# Patient Record
Sex: Female | Born: 1965 | Race: White | Hispanic: No | Smoking: Current some day smoker
Health system: Southern US, Community
[De-identification: ages and names within clinical notes are randomized; demographics above are authoritative.]

## PROBLEM LIST (undated history)

## (undated) DIAGNOSIS — E1140 Type 2 diabetes mellitus with diabetic neuropathy, unspecified: Secondary | ICD-10-CM

## (undated) DIAGNOSIS — I2510 Atherosclerotic heart disease of native coronary artery without angina pectoris: Secondary | ICD-10-CM

## (undated) DIAGNOSIS — F329 Major depressive disorder, single episode, unspecified: Secondary | ICD-10-CM

## (undated) DIAGNOSIS — I5032 Chronic diastolic (congestive) heart failure: Secondary | ICD-10-CM

## (undated) DIAGNOSIS — I10 Essential (primary) hypertension: Secondary | ICD-10-CM

## (undated) DIAGNOSIS — F32A Depression, unspecified: Secondary | ICD-10-CM

## (undated) DIAGNOSIS — I739 Peripheral vascular disease, unspecified: Secondary | ICD-10-CM

## (undated) DIAGNOSIS — E785 Hyperlipidemia, unspecified: Secondary | ICD-10-CM

## (undated) DIAGNOSIS — J45909 Unspecified asthma, uncomplicated: Secondary | ICD-10-CM

## (undated) DIAGNOSIS — E119 Type 2 diabetes mellitus without complications: Secondary | ICD-10-CM

## (undated) DIAGNOSIS — I219 Acute myocardial infarction, unspecified: Secondary | ICD-10-CM

## (undated) HISTORY — DX: Peripheral vascular disease, unspecified: I73.9

## (undated) HISTORY — DX: Depression, unspecified: F32.A

## (undated) HISTORY — PX: BELOW KNEE LEG AMPUTATION: SUR23

## (undated) HISTORY — DX: Type 2 diabetes mellitus with diabetic neuropathy, unspecified: E11.40

## (undated) HISTORY — PX: CORONARY ARTERY BYPASS GRAFT: SHX141

## (undated) HISTORY — DX: Atherosclerotic heart disease of native coronary artery without angina pectoris: I25.10

## (undated) HISTORY — DX: Type 2 diabetes mellitus without complications: E11.9

## (undated) HISTORY — DX: Acute myocardial infarction, unspecified: I21.9

## (undated) HISTORY — DX: Hyperlipidemia, unspecified: E78.5

## (undated) HISTORY — DX: Essential (primary) hypertension: I10

## (undated) HISTORY — DX: Chronic diastolic (congestive) heart failure: I50.32

## (undated) HISTORY — DX: Unspecified asthma, uncomplicated: J45.909

## (undated) HISTORY — DX: Major depressive disorder, single episode, unspecified: F32.9

---

## 2010-10-03 HISTORY — PX: CARDIAC CATHETERIZATION: SHX172

## 2013-01-16 ENCOUNTER — Inpatient Hospital Stay: Payer: Self-pay | Admitting: Internal Medicine

## 2013-01-16 LAB — COMPREHENSIVE METABOLIC PANEL
Albumin: 2.2 g/dL — ABNORMAL LOW (ref 3.4–5.0)
Alkaline Phosphatase: 185 U/L — ABNORMAL HIGH (ref 50–136)
Anion Gap: 8 (ref 7–16)
BUN: 34 mg/dL — ABNORMAL HIGH (ref 7–18)
Bilirubin,Total: 1.1 mg/dL — ABNORMAL HIGH (ref 0.2–1.0)
Calcium, Total: 8 mg/dL — ABNORMAL LOW (ref 8.5–10.1)
Chloride: 89 mmol/L — ABNORMAL LOW (ref 98–107)
Glucose: 398 mg/dL — ABNORMAL HIGH (ref 65–99)
Sodium: 123 mmol/L — ABNORMAL LOW (ref 136–145)

## 2013-01-16 LAB — CBC WITH DIFFERENTIAL/PLATELET
Lymphocyte %: 4.8 %
MCH: 30.1 pg (ref 26.0–34.0)
MCHC: 33.5 g/dL (ref 32.0–36.0)
MCV: 90 fL (ref 80–100)
Monocyte %: 3.5 %
Neutrophil #: 29.5 10*3/uL — ABNORMAL HIGH (ref 1.4–6.5)
Neutrophil %: 91.1 %
Platelet: 307 10*3/uL (ref 150–440)
RDW: 14.3 % (ref 11.5–14.5)
WBC: 32.4 10*3/uL — ABNORMAL HIGH (ref 3.6–11.0)

## 2013-01-16 LAB — URINALYSIS, COMPLETE
Bilirubin,UR: NEGATIVE
Blood: NEGATIVE
Glucose,UR: 150 mg/dL (ref 0–75)
Ketone: NEGATIVE
Nitrite: NEGATIVE
Ph: 5 (ref 4.5–8.0)
RBC,UR: 6 /HPF (ref 0–5)
Specific Gravity: 1.021 (ref 1.003–1.030)

## 2013-01-16 LAB — BASIC METABOLIC PANEL
BUN: 34 mg/dL — ABNORMAL HIGH (ref 7–18)
Calcium, Total: 7.2 mg/dL — ABNORMAL LOW (ref 8.5–10.1)
Chloride: 97 mmol/L — ABNORMAL LOW (ref 98–107)
Creatinine: 2.99 mg/dL — ABNORMAL HIGH (ref 0.60–1.30)
Glucose: 203 mg/dL — ABNORMAL HIGH (ref 65–99)
Potassium: 3.7 mmol/L (ref 3.5–5.1)
Sodium: 126 mmol/L — ABNORMAL LOW (ref 136–145)

## 2013-01-16 LAB — PROTEIN / CREATININE RATIO, URINE
Creatinine, Urine: 83.4 mg/dL (ref 30.0–125.0)
Protein/Creat. Ratio: 947 mg/gCREAT — ABNORMAL HIGH (ref 0–200)

## 2013-01-16 LAB — FERRITIN: Ferritin (ARMC): 133 ng/mL (ref 8–388)

## 2013-01-16 LAB — IRON AND TIBC: Iron: 14 ug/dL — ABNORMAL LOW (ref 50–170)

## 2013-01-16 LAB — HEMOGLOBIN A1C: Hemoglobin A1C: 9.3 % — ABNORMAL HIGH (ref 4.2–6.3)

## 2013-01-17 LAB — COMPREHENSIVE METABOLIC PANEL
Alkaline Phosphatase: 209 U/L — ABNORMAL HIGH (ref 50–136)
BUN: 35 mg/dL — ABNORMAL HIGH (ref 7–18)
Bilirubin,Total: 1 mg/dL (ref 0.2–1.0)
Calcium, Total: 7.6 mg/dL — ABNORMAL LOW (ref 8.5–10.1)
Co2: 24 mmol/L (ref 21–32)
EGFR (African American): 28 — ABNORMAL LOW
EGFR (Non-African Amer.): 25 — ABNORMAL LOW
SGPT (ALT): 83 U/L — ABNORMAL HIGH (ref 12–78)
Sodium: 129 mmol/L — ABNORMAL LOW (ref 136–145)

## 2013-01-17 LAB — CBC WITH DIFFERENTIAL/PLATELET
Basophil %: 0.1 %
Eosinophil #: 0.2 10*3/uL (ref 0.0–0.7)
Eosinophil %: 0.6 %
Lymphocyte #: 0.9 10*3/uL — ABNORMAL LOW (ref 1.0–3.6)
MCH: 30.2 pg (ref 26.0–34.0)
MCHC: 33.8 g/dL (ref 32.0–36.0)
Neutrophil %: 90.7 %
RBC: 3.38 10*6/uL — ABNORMAL LOW (ref 3.80–5.20)
RDW: 13.9 % (ref 11.5–14.5)
WBC: 27.4 10*3/uL — ABNORMAL HIGH (ref 3.6–11.0)

## 2013-01-17 LAB — PROTIME-INR
INR: 1.3
Prothrombin Time: 16.5 secs — ABNORMAL HIGH (ref 11.5–14.7)

## 2013-01-17 LAB — PROTEIN ELECTROPHORESIS(ARMC)

## 2013-01-18 LAB — BASIC METABOLIC PANEL
Calcium, Total: 8.2 mg/dL — ABNORMAL LOW (ref 8.5–10.1)
Chloride: 101 mmol/L (ref 98–107)
Creatinine: 1.73 mg/dL — ABNORMAL HIGH (ref 0.60–1.30)
EGFR (African American): 39 — ABNORMAL LOW
EGFR (Non-African Amer.): 34 — ABNORMAL LOW
Glucose: 219 mg/dL — ABNORMAL HIGH (ref 65–99)
Osmolality: 279 (ref 275–301)
Potassium: 4 mmol/L (ref 3.5–5.1)
Sodium: 133 mmol/L — ABNORMAL LOW (ref 136–145)

## 2013-01-18 LAB — CBC WITH DIFFERENTIAL/PLATELET
Basophil #: 0 10*3/uL (ref 0.0–0.1)
Basophil %: 0.2 %
Eosinophil #: 0.3 10*3/uL (ref 0.0–0.7)
HGB: 10 g/dL — ABNORMAL LOW (ref 12.0–16.0)
Lymphocyte #: 1.5 10*3/uL (ref 1.0–3.6)
Lymphocyte %: 5.9 %
MCH: 30.1 pg (ref 26.0–34.0)
MCV: 89 fL (ref 80–100)
Monocyte #: 1.2 x10 3/mm — ABNORMAL HIGH (ref 0.2–0.9)
Monocyte %: 5 %
Neutrophil #: 21.8 10*3/uL — ABNORMAL HIGH (ref 1.4–6.5)
Neutrophil %: 87.5 %
Platelet: 253 10*3/uL (ref 150–440)
RBC: 3.32 10*6/uL — ABNORMAL LOW (ref 3.80–5.20)
WBC: 25 10*3/uL — ABNORMAL HIGH (ref 3.6–11.0)

## 2013-01-19 LAB — BASIC METABOLIC PANEL
Anion Gap: 9 (ref 7–16)
BUN: 20 mg/dL — ABNORMAL HIGH (ref 7–18)
Calcium, Total: 7.8 mg/dL — ABNORMAL LOW (ref 8.5–10.1)
Co2: 23 mmol/L (ref 21–32)
Creatinine: 1.32 mg/dL — ABNORMAL HIGH (ref 0.60–1.30)
EGFR (African American): 55 — ABNORMAL LOW
EGFR (Non-African Amer.): 47 — ABNORMAL LOW
Glucose: 206 mg/dL — ABNORMAL HIGH (ref 65–99)
Osmolality: 279 (ref 275–301)
Sodium: 135 mmol/L — ABNORMAL LOW (ref 136–145)

## 2013-01-19 LAB — CBC WITH DIFFERENTIAL/PLATELET
Basophil #: 0.1 10*3/uL (ref 0.0–0.1)
Basophil %: 0.5 %
Eosinophil #: 0.2 10*3/uL (ref 0.0–0.7)
Eosinophil %: 1 %
HCT: 25.7 % — ABNORMAL LOW (ref 35.0–47.0)
Lymphocyte #: 1.4 10*3/uL (ref 1.0–3.6)
Lymphocyte %: 7.5 %
MCH: 30.2 pg (ref 26.0–34.0)
MCHC: 33.7 g/dL (ref 32.0–36.0)
Monocyte #: 1 x10 3/mm — ABNORMAL HIGH (ref 0.2–0.9)
Neutrophil #: 16.5 10*3/uL — ABNORMAL HIGH (ref 1.4–6.5)
Platelet: 262 10*3/uL (ref 150–440)
RBC: 2.88 10*6/uL — ABNORMAL LOW (ref 3.80–5.20)

## 2013-01-20 DIAGNOSIS — I200 Unstable angina: Secondary | ICD-10-CM

## 2013-01-20 LAB — CBC WITH DIFFERENTIAL/PLATELET
Basophil %: 0.5 %
Eosinophil #: 0.1 10*3/uL (ref 0.0–0.7)
HCT: 25.9 % — ABNORMAL LOW (ref 35.0–47.0)
HGB: 8.7 g/dL — ABNORMAL LOW (ref 12.0–16.0)
Lymphocyte #: 1.5 10*3/uL (ref 1.0–3.6)
Lymphocyte %: 8.3 %
MCH: 29.9 pg (ref 26.0–34.0)
MCV: 89 fL (ref 80–100)
Monocyte #: 1.2 x10 3/mm — ABNORMAL HIGH (ref 0.2–0.9)
Neutrophil #: 15.5 10*3/uL — ABNORMAL HIGH (ref 1.4–6.5)
WBC: 18.5 10*3/uL — ABNORMAL HIGH (ref 3.6–11.0)

## 2013-01-20 LAB — BASIC METABOLIC PANEL
Calcium, Total: 7.8 mg/dL — ABNORMAL LOW (ref 8.5–10.1)
Co2: 26 mmol/L (ref 21–32)
Creatinine: 1.09 mg/dL (ref 0.60–1.30)
EGFR (African American): >60
EGFR (Non-African Amer.): 60 — ABNORMAL LOW
Glucose: 88 mg/dL (ref 65–99)

## 2013-01-20 LAB — TROPONIN I: Troponin-I: 0.36 ng/mL — ABNORMAL HIGH

## 2013-01-21 DIAGNOSIS — R079 Chest pain, unspecified: Secondary | ICD-10-CM

## 2013-01-21 LAB — CBC WITH DIFFERENTIAL/PLATELET
Eosinophil #: 0.2 10*3/uL (ref 0.0–0.7)
HCT: 26.1 % — ABNORMAL LOW (ref 35.0–47.0)
HGB: 8.8 g/dL — ABNORMAL LOW (ref 12.0–16.0)
Lymphocyte #: 2.2 10*3/uL (ref 1.0–3.6)
Lymphocyte %: 11.1 %
MCH: 30.2 pg (ref 26.0–34.0)
MCHC: 33.6 g/dL (ref 32.0–36.0)
Monocyte #: 1.2 x10 3/mm — ABNORMAL HIGH (ref 0.2–0.9)
Neutrophil #: 16.1 10*3/uL — ABNORMAL HIGH (ref 1.4–6.5)
RBC: 2.9 10*6/uL — ABNORMAL LOW (ref 3.80–5.20)

## 2013-01-21 LAB — BASIC METABOLIC PANEL
Chloride: 104 mmol/L (ref 98–107)
Co2: 27 mmol/L (ref 21–32)
EGFR (African American): >60
EGFR (Non-African Amer.): >60
Osmolality: 271 (ref 275–301)
Sodium: 137 mmol/L (ref 136–145)

## 2013-01-21 LAB — TROPONIN I: Troponin-I: 0.44 ng/mL — ABNORMAL HIGH

## 2013-01-21 LAB — CK-MB: CK-MB: 1.4 ng/mL (ref 0.5–3.6)

## 2013-01-21 LAB — CULTURE, BLOOD (SINGLE)

## 2013-01-22 LAB — CBC WITH DIFFERENTIAL/PLATELET
Basophil %: 0.2 %
Eosinophil #: 0.2 10*3/uL (ref 0.0–0.7)
Eosinophil %: 0.9 %
HGB: 8.6 g/dL — ABNORMAL LOW (ref 12.0–16.0)
Lymphocyte %: 11.5 %
MCV: 90 fL (ref 80–100)
Monocyte #: 0.9 x10 3/mm (ref 0.2–0.9)
Neutrophil #: 14.2 10*3/uL — ABNORMAL HIGH (ref 1.4–6.5)
RDW: 14.1 % (ref 11.5–14.5)
WBC: 17.3 10*3/uL — ABNORMAL HIGH (ref 3.6–11.0)

## 2013-01-22 LAB — DRUG SCREEN, URINE
Barbiturates, Ur Screen: NEGATIVE (ref ?–200)
Benzodiazepine, Ur Scrn: NEGATIVE (ref ?–200)
Cannabinoid 50 Ng, Ur ~~LOC~~: NEGATIVE (ref ?–50)
MDMA (Ecstasy)Ur Screen: NEGATIVE (ref ?–500)
Methadone, Ur Screen: NEGATIVE (ref ?–300)
Phencyclidine (PCP) Ur S: NEGATIVE (ref ?–25)
Tricyclic, Ur Screen: NEGATIVE (ref ?–1000)

## 2013-01-22 LAB — BASIC METABOLIC PANEL
BUN: 8 mg/dL (ref 7–18)
Chloride: 103 mmol/L (ref 98–107)
Glucose: 183 mg/dL — ABNORMAL HIGH (ref 65–99)
Potassium: 4 mmol/L (ref 3.5–5.1)

## 2013-01-22 LAB — PATHOLOGY REPORT

## 2013-01-23 LAB — BASIC METABOLIC PANEL
Anion Gap: 10 (ref 7–16)
Calcium, Total: 7.1 mg/dL — ABNORMAL LOW (ref 8.5–10.1)
Co2: 27 mmol/L (ref 21–32)
EGFR (African American): >60
EGFR (Non-African Amer.): 59 — ABNORMAL LOW
Glucose: 242 mg/dL — ABNORMAL HIGH (ref 65–99)
Sodium: 133 mmol/L — ABNORMAL LOW (ref 136–145)

## 2013-01-23 LAB — CBC WITH DIFFERENTIAL/PLATELET
Basophil #: 0 10*3/uL (ref 0.0–0.1)
Basophil %: 0.1 %
Eosinophil %: 1.5 %
HCT: 23.2 % — ABNORMAL LOW (ref 35.0–47.0)
Lymphocyte %: 14.1 %
MCHC: 33.3 g/dL (ref 32.0–36.0)
MCV: 90 fL (ref 80–100)
Monocyte #: 0.8 x10 3/mm (ref 0.2–0.9)
Monocyte %: 6 %
Neutrophil #: 10.9 10*3/uL — ABNORMAL HIGH (ref 1.4–6.5)
Neutrophil %: 78.3 %
Platelet: 415 10*3/uL (ref 150–440)
RDW: 14.3 % (ref 11.5–14.5)

## 2013-01-24 LAB — CBC WITH DIFFERENTIAL/PLATELET
Basophil #: 0 10*3/uL (ref 0.0–0.1)
Basophil %: 0.3 %
Eosinophil #: 0.2 10*3/uL (ref 0.0–0.7)
Eosinophil %: 1.4 %
HCT: 23.2 % — ABNORMAL LOW (ref 35.0–47.0)
MCV: 90 fL (ref 80–100)
Monocyte #: 0.8 x10 3/mm (ref 0.2–0.9)
Monocyte %: 5 %
Neutrophil %: 80.7 %
Platelet: 553 10*3/uL — ABNORMAL HIGH (ref 150–440)
WBC: 15.5 10*3/uL — ABNORMAL HIGH (ref 3.6–11.0)

## 2013-01-24 LAB — BASIC METABOLIC PANEL
BUN: 8 mg/dL (ref 7–18)
Calcium, Total: 7.3 mg/dL — ABNORMAL LOW (ref 8.5–10.1)
Co2: 29 mmol/L (ref 21–32)
Creatinine: 1.09 mg/dL (ref 0.60–1.30)
EGFR (African American): >60
Glucose: 310 mg/dL — ABNORMAL HIGH (ref 65–99)
Osmolality: 275 (ref 275–301)
Potassium: 3.7 mmol/L (ref 3.5–5.1)
Sodium: 132 mmol/L — ABNORMAL LOW (ref 136–145)

## 2013-01-24 LAB — VANCOMYCIN, TROUGH: Vancomycin, Trough: 12 ug/mL (ref 10–20)

## 2013-01-25 LAB — CBC WITH DIFFERENTIAL/PLATELET
Basophil %: 0.4 %
Eosinophil #: 0.2 10*3/uL (ref 0.0–0.7)
Eosinophil %: 1.6 %
Lymphocyte #: 1.9 10*3/uL (ref 1.0–3.6)
MCV: 90 fL (ref 80–100)
Monocyte #: 0.5 x10 3/mm (ref 0.2–0.9)
Monocyte %: 4.9 %
Neutrophil #: 8.3 10*3/uL — ABNORMAL HIGH (ref 1.4–6.5)
Neutrophil %: 75.8 %
Platelet: 513 10*3/uL — ABNORMAL HIGH (ref 150–440)
RBC: 2.57 10*6/uL — ABNORMAL LOW (ref 3.80–5.20)
RDW: 14.1 % (ref 11.5–14.5)

## 2013-01-26 ENCOUNTER — Inpatient Hospital Stay: Payer: Self-pay | Admitting: Internal Medicine

## 2013-01-26 DIAGNOSIS — R079 Chest pain, unspecified: Secondary | ICD-10-CM

## 2013-01-26 LAB — URINALYSIS, COMPLETE
Bacteria: NONE SEEN
Bilirubin,UR: NEGATIVE
Glucose,UR: 50 mg/dL (ref 0–75)
Ketone: NEGATIVE
Leukocyte Esterase: NEGATIVE
Nitrite: NEGATIVE
Ph: 6 (ref 4.5–8.0)
Protein: >=500
RBC,UR: 169 /HPF (ref 0–5)
Specific Gravity: 1.03 (ref 1.003–1.030)
WBC UR: 14 /HPF (ref 0–5)

## 2013-01-26 LAB — DRUG SCREEN, URINE
Amphetamines, Ur Screen: NEGATIVE (ref ?–1000)
Barbiturates, Ur Screen: NEGATIVE (ref ?–200)
Benzodiazepine, Ur Scrn: POSITIVE (ref ?–200)
Cannabinoid 50 Ng, Ur ~~LOC~~: NEGATIVE (ref ?–50)
Cocaine Metabolite,Ur ~~LOC~~: NEGATIVE (ref ?–300)
MDMA (Ecstasy)Ur Screen: NEGATIVE (ref ?–500)
Methadone, Ur Screen: NEGATIVE (ref ?–300)
Tricyclic, Ur Screen: NEGATIVE (ref ?–1000)

## 2013-01-26 LAB — ETHANOL
Ethanol %: <0.003 % (ref 0.000–0.080)
Ethanol: <3 mg/dL

## 2013-01-26 LAB — BASIC METABOLIC PANEL
Anion Gap: 8 (ref 7–16)
BUN: 11 mg/dL (ref 7–18)
Co2: 27 mmol/L (ref 21–32)
EGFR (African American): >60
Glucose: 243 mg/dL — ABNORMAL HIGH (ref 65–99)
Osmolality: 283 (ref 275–301)
Potassium: 4 mmol/L (ref 3.5–5.1)
Sodium: 138 mmol/L (ref 136–145)

## 2013-01-26 LAB — CBC
HGB: 8.9 g/dL — ABNORMAL LOW (ref 12.0–16.0)
MCH: 28.9 pg (ref 26.0–34.0)
MCV: 91 fL (ref 80–100)
Platelet: 616 10*3/uL — ABNORMAL HIGH (ref 150–440)
RDW: 14.4 % (ref 11.5–14.5)
WBC: 13.7 10*3/uL — ABNORMAL HIGH (ref 3.6–11.0)

## 2013-01-26 LAB — CK TOTAL AND CKMB (NOT AT ARMC)
CK, Total: 87 U/L (ref 21–215)
CK-MB: 1.3 ng/mL (ref 0.5–3.6)

## 2013-01-26 LAB — TROPONIN I: Troponin-I: 0.17 ng/mL — ABNORMAL HIGH

## 2013-01-26 LAB — PRO B NATRIURETIC PEPTIDE: B-Type Natriuretic Peptide: 13557 pg/mL — ABNORMAL HIGH (ref 0–125)

## 2013-01-27 LAB — CBC WITH DIFFERENTIAL/PLATELET
Basophil #: 0.1 10*3/uL (ref 0.0–0.1)
Basophil %: 0.5 %
Eosinophil #: 0.2 10*3/uL (ref 0.0–0.7)
Eosinophil %: 1.2 %
HGB: 8.7 g/dL — ABNORMAL LOW (ref 12.0–16.0)
Lymphocyte %: 16.6 %
MCH: 29.3 pg (ref 26.0–34.0)
MCHC: 32.3 g/dL (ref 32.0–36.0)
MCV: 91 fL (ref 80–100)
Monocyte %: 4.5 %
Neutrophil #: 10 10*3/uL — ABNORMAL HIGH (ref 1.4–6.5)
Neutrophil %: 77.2 %
Platelet: 652 10*3/uL — ABNORMAL HIGH (ref 150–440)
RDW: 14.4 % (ref 11.5–14.5)
WBC: 13 10*3/uL — ABNORMAL HIGH (ref 3.6–11.0)

## 2013-01-27 LAB — APTT: Activated PTT: 70.4 secs — ABNORMAL HIGH (ref 23.6–35.9)

## 2013-01-27 LAB — TROPONIN I: Troponin-I: 0.16 ng/mL — ABNORMAL HIGH

## 2013-01-27 LAB — BASIC METABOLIC PANEL
Anion Gap: 6 — ABNORMAL LOW (ref 7–16)
Calcium, Total: 8.1 mg/dL — ABNORMAL LOW (ref 8.5–10.1)
Co2: 30 mmol/L (ref 21–32)
Creatinine: 1.1 mg/dL (ref 0.60–1.30)
EGFR (African American): >60
EGFR (Non-African Amer.): 59 — ABNORMAL LOW
Potassium: 4 mmol/L (ref 3.5–5.1)
Sodium: 139 mmol/L (ref 136–145)

## 2013-01-27 LAB — CK TOTAL AND CKMB (NOT AT ARMC): CK, Total: 37 U/L (ref 21–215)

## 2013-01-28 DIAGNOSIS — R079 Chest pain, unspecified: Secondary | ICD-10-CM

## 2013-01-28 DIAGNOSIS — I214 Non-ST elevation (NSTEMI) myocardial infarction: Secondary | ICD-10-CM

## 2013-01-28 LAB — BASIC METABOLIC PANEL
BUN: 16 mg/dL (ref 7–18)
Calcium, Total: 8.2 mg/dL — ABNORMAL LOW (ref 8.5–10.1)
Chloride: 99 mmol/L (ref 98–107)
Creatinine: 1.32 mg/dL — ABNORMAL HIGH (ref 0.60–1.30)
EGFR (African American): 55 — ABNORMAL LOW
Osmolality: 276 (ref 275–301)
Potassium: 4 mmol/L (ref 3.5–5.1)

## 2013-01-28 LAB — CBC WITH DIFFERENTIAL/PLATELET
Basophil %: 0.5 %
Eosinophil #: 0.3 10*3/uL (ref 0.0–0.7)
Eosinophil %: 2.1 %
HCT: 23.7 % — ABNORMAL LOW (ref 35.0–47.0)
Lymphocyte #: 2.4 10*3/uL (ref 1.0–3.6)
Lymphocyte %: 18.6 %
MCH: 30.3 pg (ref 26.0–34.0)
MCV: 89 fL (ref 80–100)
Monocyte %: 7 %
Neutrophil %: 71.8 %
RBC: 2.67 10*6/uL — ABNORMAL LOW (ref 3.80–5.20)
RDW: 14.4 % (ref 11.5–14.5)

## 2013-01-28 LAB — HEMOGLOBIN: HGB: 8.1 g/dL — ABNORMAL LOW (ref 12.0–16.0)

## 2013-01-28 LAB — APTT: Activated PTT: 55.3 secs — ABNORMAL HIGH (ref 23.6–35.9)

## 2013-01-29 ENCOUNTER — Encounter: Payer: Self-pay | Admitting: *Deleted

## 2013-01-30 ENCOUNTER — Encounter: Payer: Self-pay | Admitting: Physician Assistant

## 2013-01-30 ENCOUNTER — Ambulatory Visit (INDEPENDENT_AMBULATORY_CARE_PROVIDER_SITE_OTHER): Payer: Medicaid Other | Admitting: Physician Assistant

## 2013-01-30 DIAGNOSIS — I25810 Atherosclerosis of coronary artery bypass graft(s) without angina pectoris: Secondary | ICD-10-CM

## 2013-01-30 NOTE — Progress Notes (Signed)
Error. No show.

## 2013-01-30 NOTE — Progress Notes (Deleted)
Date:  01/30/2013   ID:  [name], DOB [birth year], MRN [id number]  PCP:  No primary provider on file.  Primary Cardiologist:  M. Kirke Corin, MD  History of Present Illness: [name] is a 47 y.o. female with PMHx s/f CAD s/p CABG at [facility] in 11/2011, chronic diastolic CHF (EF 69%), PVD s/p LLE amputation 2013, segmental RLE angioplasties 01/23/13, type 2 DM with diabetic neuropathy, HTN, HLD, asthma and depression who presents to the office today for post-hospital follow-up.  She was seen twice in consultation last month at [facility]. Her initial admission was in the setting of severe RLE PAD with resultant nonhealing ulcers, cellulitis and sepsis. Cardiology was consulted for pre-op eval prior to revascularization. She was noted to have chronic CAD with stable angina, was optimized on cardiac medications, and underwent multiple segmental angioplasties to her RLE.   After discharge on 01/26/15, she re-presented the following day to [facility] ED c/o chest discomfort. Troponin did return mildly elevated ruling her in for NSTEMI. CT-A chest revealed no PE, mild interstitial edema and reactive lymphadenopathy. She was evaluated by Dr. Jens Som, and the plan was made to proceed with noninvasive ischemic testing due to her underlying comorbidities, ongoing recovery from recent sepsis/cellulitisl/RLE revascularization and history of contrast-induced nephropathy. She was continued on all cardiac meds including Lasix.   Lexiscan Myoview 01/28/13 revealed significant GI uptake artifact, likely moderate mid-distal anterior and anterolateral wall reversible ischemia and EF 55%.  She was seen by Dr. Kirke Corin the following day, and given the aforementioned factors increasing the risk of cardiac cath, the decision was made to treat medically. BB was up-titrated. She presents today for post-hospital follow-up.    Wt Readings from Last 3 Encounters:  No data found for Wt     Past Medical History  Diagnosis Date   . Coronary artery disease   . Diabetes mellitus without complication   . Diabetic neuropathy   . Depression   . Hyperlipidemia   . Asthma   . Peripheral artery disease     s/p LLE amputation 2013; segmental angioplasties to RLE 01/23/13  . Chronic diastolic CHF (congestive heart failure)     Current Outpatient Prescriptions  Medication Sig Dispense Refill  . acetaminophen (MAPAP) 325 MG tablet Take 650 mg by mouth 4 (four) times daily.      Marland Kitchen aspirin 81 MG tablet Take 81 mg by mouth daily.      . bisacodyl (DULCOLAX) 5 MG EC tablet Take 10 mg by mouth daily.      Marland Kitchen docusate sodium (COLACE) 100 MG capsule Take 100 mg by mouth 3 (three) times daily.      . ferrous sulfate 325 (65 FE) MG tablet Take 325 mg by mouth daily with breakfast.      . guaifenesin (ROBITUSSIN) 100 MG/5ML syrup Take 10 mLs by mouth every 6 (six) hours as needed for cough.      . insulin regular (NOVOLIN R,HUMULIN R) 100 units/mL injection Inject 17 Units into the skin 2 (two) times daily before a meal.      . isosorbide mononitrate (IMDUR) 60 MG 24 hr tablet Take 60 mg by mouth daily.      . nitroGLYCERIN (NITROSTAT) 0.4 MG SL tablet Place 0.4 mg under the tongue every 5 (five) minutes as needed for chest pain.      Marland Kitchen oxyCODONE (OXY IR/ROXICODONE) 5 MG immediate release tablet Take 5 mg by mouth as needed for pain.      . promethazine (  PHENERGAN) 25 MG tablet Take 25 mg by mouth every 6 (six) hours as needed for nausea.      . silver sulfADIAZINE (SILVADENE) 1 % cream Apply topically daily.      . traZODone (DESYREL) 50 MG tablet Take 50 mg by mouth at bedtime.       No current facility-administered medications for this visit.    Allergies:   Allergies no known allergies  Social History:  The patient  reports that she has quit smoking. She does not have any smokeless tobacco history on file. She reports that she does not drink alcohol or use illicit drugs.   ROS:  Please see the history of present illness.    ***   All other systems reviewed and negative.   PHYSICAL EXAM: VS:  There were no vitals taken for this visit. Well nourished, well developed, in no acute distress HEENT: normal Neck: no JVD Cardiac:  normal S1, S2; RRR; no murmur Lungs:  clear to auscultation bilaterally, no wheezing, rhonchi or rales Abd: soft, nontender, no hepatomegaly Ext: no edema Skin: warm and dry Neuro:  CNs 2-12 intact, no focal abnormalities noted  EKG:  ***     ASSESSMENT AND PLAN:  1. ***   Signed,   R. Hurman Horn, PA-C 01/30/2013, 11:39 AM

## 2013-02-01 ENCOUNTER — Telehealth: Payer: Self-pay

## 2013-02-01 ENCOUNTER — Encounter: Payer: Self-pay | Admitting: Physician Assistant

## 2013-02-01 ENCOUNTER — Other Ambulatory Visit: Payer: Self-pay

## 2013-02-01 ENCOUNTER — Ambulatory Visit (INDEPENDENT_AMBULATORY_CARE_PROVIDER_SITE_OTHER): Payer: Medicaid Other | Admitting: Physician Assistant

## 2013-02-01 VITALS — BP 156/90 | HR 106 | Ht 64.5 in | Wt 205.0 lb

## 2013-02-01 DIAGNOSIS — I2510 Atherosclerotic heart disease of native coronary artery without angina pectoris: Secondary | ICD-10-CM

## 2013-02-01 DIAGNOSIS — I5032 Chronic diastolic (congestive) heart failure: Secondary | ICD-10-CM

## 2013-02-01 DIAGNOSIS — I739 Peripheral vascular disease, unspecified: Secondary | ICD-10-CM

## 2013-02-01 DIAGNOSIS — I509 Heart failure, unspecified: Secondary | ICD-10-CM

## 2013-02-01 DIAGNOSIS — R0902 Hypoxemia: Secondary | ICD-10-CM

## 2013-02-01 DIAGNOSIS — R0609 Other forms of dyspnea: Secondary | ICD-10-CM

## 2013-02-01 DIAGNOSIS — R0600 Dyspnea, unspecified: Secondary | ICD-10-CM

## 2013-02-01 LAB — CBC WITH DIFFERENTIAL/PLATELET
Basophil %: 0.3 %
Eosinophil #: 0.1 10*3/uL (ref 0.0–0.7)
Eosinophil %: 0.8 %
HCT: 29.8 % — ABNORMAL LOW (ref 35.0–47.0)
Lymphocyte #: 1.8 10*3/uL (ref 1.0–3.6)
MCH: 30.1 pg (ref 26.0–34.0)
MCV: 90 fL (ref 80–100)
Monocyte #: 1.1 x10 3/mm — ABNORMAL HIGH (ref 0.2–0.9)
Monocyte %: 7.1 %
Neutrophil #: 12.3 10*3/uL — ABNORMAL HIGH (ref 1.4–6.5)
Platelet: 653 10*3/uL — ABNORMAL HIGH (ref 150–440)
RDW: 15.3 % — ABNORMAL HIGH (ref 11.5–14.5)

## 2013-02-01 LAB — COMPREHENSIVE METABOLIC PANEL
Albumin: 1.8 g/dL — ABNORMAL LOW (ref 3.4–5.0)
Alkaline Phosphatase: 178 U/L — ABNORMAL HIGH (ref 50–136)
Anion Gap: 5 — ABNORMAL LOW (ref 7–16)
BUN: 25 mg/dL — ABNORMAL HIGH (ref 7–18)
Calcium, Total: 8.8 mg/dL (ref 8.5–10.1)
Creatinine: 1.33 mg/dL — ABNORMAL HIGH (ref 0.60–1.30)
EGFR (African American): 54 — ABNORMAL LOW
Osmolality: 286 (ref 275–301)
Potassium: 4.4 mmol/L (ref 3.5–5.1)

## 2013-02-01 MED ORDER — NITROGLYCERIN 0.4 MG SL SUBL: 0.4000 mg | SUBLINGUAL_TABLET | SUBLINGUAL | Status: AC | PRN

## 2013-02-01 MED ORDER — METOPROLOL TARTRATE 100 MG PO TABS: 100.0000 mg | ORAL_TABLET | Freq: Two times a day (BID) | ORAL | Status: DC

## 2013-02-01 NOTE — Assessment & Plan Note (Signed)
Suspect shortness of breath may be attributed to developed acute on chronic diastolic CHF and/or as part of a constellation of unstable cardiac ischemic symptoms. She is hypoxic in the office today requiring supplemental oxygen. Fairly euvolemic on exam with clear lungs. Have advised to continue Lasix use, limit salt/fluid intake, monitor daily weights and symptoms. She will call with signs/symptoms concerning for worsening fluid overload.

## 2013-02-01 NOTE — Telephone Encounter (Signed)
Verbal instructions given to pt re: cath and I also informed her [facility] may be bringing O2 today, per Henderson Newcomer, RN with [facility]

## 2013-02-01 NOTE — Telephone Encounter (Signed)
error 

## 2013-02-01 NOTE — Progress Notes (Signed)
Date:  02/01/2013   ID:  [name], DOB [birth year], MRN [id number]  PCP:  Pcp Not In System  Primary Cardiologist:  Seen by M. Kirke Corin, MD in consultation at [facility]  History of Present Illness: [name] is a 47 y.o. female with PMHx s/f CAD s/p CABG (11/2011 at [facility]), PVD (s/p L BKA in 09/2012 at [facility]), chronic diastolic CHF, DM2 with diabetic neuropathy/vasculopathy, HLD, asthma and depression.   She was seen in consultation by myself and Dr. Kirke Corin on 01/21/13. She was admitted to [facility] on 01/16/13 for sepsis secondary to RLE cellulitis with progressive necrosis. She presented severe RLE pain, redness and swelling. On evaluation, she was found to be septic, which was managed and stabilized by the medicine team. There was no evidence of osteomyelitis. She was started on broad-spectrum antibiotics. Cardiology was consulted for a pre-op evaluation prior to undergoing any revascularization procedure.   On review, her troponin did elevate mildly. The significance of this was unclear given co-occuring sepsis, acute renal failure, lack of chest pain on admission and no EKG changes, but was felt to represent demand ischemia from her active infection. She did have an episode of chest discomfort several days into the admission relieved with NTG. EKG again remained normal. There were no further episodes.  She underwent a Lexiscan Myoview which revealed LVEF 55%, moderate size reversible defect in the mid-distal anterior and anterolateral wall likely felt to represent anterior/anterolateral ischemia despite poor quality study and intense GI uptake. Options including cardiac catheterization were discussed with the patient who elected to defer. Medical management was pursued.   She underwent segmental balloon angioplasties to the RLE peripheral vascular system. She was discharged on 01/25/13 on the medications listed below.   She presents today for post-hospital follow-up. Her leg has felt well.  Swelling and pain improving. She has home health care, active wound care and vascular appointments lined up.   She reports experiencing intermittent substernal chest pressure lasting 5-6 minutes without radiation, relieved with NTG SL x 1, similar in quality, but less in severity to her prior MI for the past 1-2 days. She notes associated shortness of breath with this. She has been short of breath all day today. She is on supplementation provided in the office today, and her breathing has improved. She has continued to take all cardiac medications as prescribed. She only recently filled her Lasix prescription, and took her first dose yesterday.   EKG reveals sinus tachycardia 106 bpm, <1 mm ST depressions V2-V6, TWIs I, aVL (unchanged from prior tracings)  BP 156/90.  Wt Readings from Last 3 Encounters:  02/01/13 205 lb (92.987 kg)     Past Medical History  Diagnosis Date  . Diabetes mellitus   . Diabetic neuropathy   . Depression   . Hyperlipidemia   . Asthma   . Peripheral artery disease     s/p LLE amputation 2013; segmental angioplasties to RLE 01/23/13  . Chronic diastolic CHF (congestive heart failure)   . Coronary artery disease     a. CABG 11/2011 at [facility] b. NSTEMI 01/2013- nuc stress test w/ moderate anterior reversible ischemia, EF 55%, medically managed  . MI (myocardial infarction)   . Hypertension     Current Outpatient Prescriptions  Medication Sig Dispense Refill  . acetaminophen (MAPAP) 325 MG tablet Take 650 mg by mouth as needed.       Marland Kitchen aspirin 81 MG tablet Take 81 mg by mouth daily.      Marland Kitchen  atorvastatin (LIPITOR) 40 MG tablet Take 40 mg by mouth daily.      . clopidogrel (PLAVIX) 75 MG tablet Take 75 mg by mouth daily.      Marland Kitchen docusate sodium (COLACE) 100 MG capsule Take 100 mg by mouth 3 (three) times daily.      . DULoxetine (CYMBALTA) 60 MG capsule Take 60 mg by mouth daily.      . ferrous sulfate 325 (65 FE) MG tablet Take 325 mg by mouth daily with breakfast.       . gabapentin (NEURONTIN) 300 MG capsule Take 300 mg by mouth 3 (three) times daily.      Marland Kitchen guaifenesin (ROBITUSSIN) 100 MG/5ML syrup Take 10 mLs by mouth every 6 (six) hours as needed for cough.      . Ipratropium-Albuterol (COMBIVENT RESPIMAT) 20-100 MCG/ACT AERS respimat Inhale 2 puffs into the lungs as needed for wheezing.      . isosorbide mononitrate (IMDUR) 60 MG 24 hr tablet Take 60 mg by mouth daily.      . metoprolol (LOPRESSOR) 50 MG tablet Take 50 mg by mouth 2 (two) times daily.      . nitroGLYCERIN (NITROSTAT) 0.4 MG SL tablet Place 0.4 mg under the tongue every 5 (five) minutes as needed for chest pain.      Marland Kitchen oxyCODONE (OXY IR/ROXICODONE) 5 MG immediate release tablet Take 5 mg by mouth as needed for pain.      . promethazine (PHENERGAN) 25 MG tablet Take 25 mg by mouth daily.       . silver sulfADIAZINE (SILVADENE) 1 % cream Apply topically daily.      . traZODone (DESYREL) 50 MG tablet Take 50 mg by mouth at bedtime.      Marland Kitchen amoxicillin-clavulanate (AUGMENTIN) 875-125 MG per tablet Take 1 tablet by mouth 2 (two) times daily.      . insulin regular (NOVOLIN R,HUMULIN R) 100 units/mL injection Inject 20 Units into the skin 2 (two) times daily before a meal.        No current facility-administered medications for this visit.    Allergies:    Allergies  Allergen Reactions  . Accupril (Quinapril Hcl)     Social History:  The patient  reports that she has quit smoking. Her smoking use included Cigarettes. She has a 5 pack-year smoking history. She does not have any smokeless tobacco history on file. She reports that she does not drink alcohol or use illicit drugs.   ROS:  Please see the history of present illness. No fevers or chills. No active bleeding.  All other systems reviewed and negative.   PHYSICAL EXAM: VS:  BP 156/90  Pulse 106  Ht 5' 4.5" (1.638 m)  Wt 205 lb (92.987 kg)  BMI 34.66 kg/m2 Well nourished, well developed, in no acute distress HEENT:  normal Neck: no JVD Cardiac:  normal S1, S2; RRR; no murmur Lungs:  clear to auscultation bilaterally, no wheezing, rhonchi or rales Abd: soft, nontender, no hepatomegaly Ext: LLE amputation appreciated. RLE erythematous with diffuse scaling patches. Wound wraps. Palpation limited due to pain. Minimal appreciable nonpitting edema. Skin: warm and dry Neuro:  CNs 2-12 intact, no focal abnormalities noted

## 2013-02-01 NOTE — Telephone Encounter (Signed)
LMTCB WU:[id number] instructions and O2 delivery

## 2013-02-01 NOTE — Assessment & Plan Note (Addendum)
Recovering s/p RLE segmental angioplasties. Continued on ASA/Plavix, statin. Praised on sustained tobacco cessation. Controlling diabetes will be essential. Advised to continue previously scheduled vascular and PCP follow-up, and to continue wound management.

## 2013-02-01 NOTE — Patient Instructions (Addendum)
We will plan for a cardiac catheterization the week of May 12-16, 2014. You will be called when this has been scheduled.   Please increace metoprolol tartrate (Lopressor) to 100 mg twice a day.   We will refill this and your nitroglycerin. Please take as prescribed.   Please continue with your previously scheduled follow-up appointments as planned.   Please continue to take Lasix as prescribed. Monitor your weights daily, reduce salt and fluid intake. Please call the office for worsening shortness of breath, chest pain or swelling.

## 2013-02-01 NOTE — Assessment & Plan Note (Signed)
She complains of increased episodes of chest discomfort similar to her prior MI, relieved with NTG and associated with shortness of breath. She is vasculopathic with a history of CAD s/p recent CABG. She underwent a Lexiscan Myoview which was abnormal suggesting single-vessel disease. She declined cath at that time, but is now interested in the procedure given her ongoing discomfort. EKG without significant interval change. After conferring with Dr. Kirke Corin, the risks, benefits and details of the procedure were described in detail and offered to the patient who agreed to proceed. Will increase metoprolol to 100mg  BID dosing given tachycardia. Will refill NTG SL PRN.

## 2013-02-01 NOTE — Assessment & Plan Note (Signed)
Patient short of breath on initial arrival requiring supplemental oxygen in the office. O2 sat 86-87% on RA, 95-97% on 2L oxygen via nasal cannula. Unable to ambulate due to RLE amputation and wheel chair bound status after recent RLE angioplasty/cellulitis. Will provide with home oxygen.

## 2013-02-02 ENCOUNTER — Inpatient Hospital Stay: Payer: Self-pay | Admitting: Internal Medicine

## 2013-02-02 DIAGNOSIS — I5033 Acute on chronic diastolic (congestive) heart failure: Secondary | ICD-10-CM

## 2013-02-02 LAB — APTT
Activated PTT: 35 secs (ref 23.6–35.9)
Activated PTT: 51.7 secs — ABNORMAL HIGH (ref 23.6–35.9)
Activated PTT: 55 secs — ABNORMAL HIGH (ref 23.6–35.9)

## 2013-02-02 LAB — TROPONIN I: Troponin-I: 0.35 ng/mL — ABNORMAL HIGH

## 2013-02-02 LAB — CK TOTAL AND CKMB (NOT AT ARMC)
CK, Total: 76 U/L (ref 21–215)
CK, Total: 55 U/L (ref 21–215)
CK-MB: 3.1 ng/mL (ref 0.5–3.6)

## 2013-02-03 LAB — BASIC METABOLIC PANEL
Anion Gap: 4 — ABNORMAL LOW (ref 7–16)
BUN: 25 mg/dL — ABNORMAL HIGH (ref 7–18)
EGFR (Non-African Amer.): 45 — ABNORMAL LOW
Glucose: 114 mg/dL — ABNORMAL HIGH (ref 65–99)
Osmolality: 279 (ref 275–301)
Sodium: 137 mmol/L (ref 136–145)

## 2013-02-03 LAB — CBC WITH DIFFERENTIAL/PLATELET
Basophil #: 0.2 10*3/uL — ABNORMAL HIGH (ref 0.0–0.1)
Basophil %: 1.4 %
Eosinophil %: 1.9 %
HGB: 8.3 g/dL — ABNORMAL LOW (ref 12.0–16.0)
Lymphocyte %: 24.5 %
MCH: 29.8 pg (ref 26.0–34.0)
MCHC: 33.7 g/dL (ref 32.0–36.0)
MCV: 88 fL (ref 80–100)
Monocyte #: 0.9 x10 3/mm (ref 0.2–0.9)
Neutrophil #: 7.4 10*3/uL — ABNORMAL HIGH (ref 1.4–6.5)
Neutrophil %: 64.6 %
RBC: 2.79 10*6/uL — ABNORMAL LOW (ref 3.80–5.20)
WBC: 11.5 10*3/uL — ABNORMAL HIGH (ref 3.6–11.0)

## 2013-02-03 LAB — LIPID PANEL
Cholesterol: 127 mg/dL (ref 0–200)
HDL Cholesterol: 40 mg/dL (ref 40–60)
Ldl Cholesterol, Calc: 63 mg/dL (ref 0–100)
Triglycerides: 119 mg/dL (ref 0–200)
VLDL Cholesterol, Calc: 24 mg/dL (ref 5–40)

## 2013-02-03 LAB — TROPONIN I: Troponin-I: 0.41 ng/mL — ABNORMAL HIGH

## 2013-02-03 LAB — APTT
Activated PTT: 47.3 secs — ABNORMAL HIGH (ref 23.6–35.9)
Activated PTT: 71.1 secs — ABNORMAL HIGH (ref 23.6–35.9)

## 2013-02-04 ENCOUNTER — Encounter: Payer: Self-pay | Admitting: Cardiovascular Disease

## 2013-02-04 DIAGNOSIS — I214 Non-ST elevation (NSTEMI) myocardial infarction: Secondary | ICD-10-CM

## 2013-02-04 LAB — CBC WITH DIFFERENTIAL/PLATELET
Basophil #: 0.1 10*3/uL (ref 0.0–0.1)
Basophil %: 0.8 %
Eosinophil #: 0.2 10*3/uL (ref 0.0–0.7)
Eosinophil %: 1.5 %
HCT: 25.1 % — ABNORMAL LOW (ref 35.0–47.0)
HGB: 8.7 g/dL — ABNORMAL LOW (ref 12.0–16.0)
Lymphocyte #: 2.1 10*3/uL (ref 1.0–3.6)
Lymphocyte %: 19.1 %
MCH: 30.7 pg (ref 26.0–34.0)
MCHC: 34.6 g/dL (ref 32.0–36.0)
MCV: 89 fL (ref 80–100)
Monocyte #: 1.1 x10 3/mm — ABNORMAL HIGH (ref 0.2–0.9)
Monocyte %: 9.6 %
Neutrophil #: 7.7 10*3/uL — ABNORMAL HIGH (ref 1.4–6.5)
Neutrophil %: 69 %
Platelet: 466 10*3/uL — ABNORMAL HIGH (ref 150–440)
RBC: 2.84 10*6/uL — ABNORMAL LOW (ref 3.80–5.20)
RDW: 14.9 % — ABNORMAL HIGH (ref 11.5–14.5)
WBC: 11.2 10*3/uL — ABNORMAL HIGH (ref 3.6–11.0)

## 2013-02-04 LAB — BASIC METABOLIC PANEL
Anion Gap: 4 — ABNORMAL LOW (ref 7–16)
BUN: 28 mg/dL — ABNORMAL HIGH (ref 7–18)
Calcium, Total: 8.3 mg/dL — ABNORMAL LOW (ref 8.5–10.1)
Chloride: 97 mmol/L — ABNORMAL LOW (ref 98–107)
Co2: 32 mmol/L (ref 21–32)
Creatinine: 1.6 mg/dL — ABNORMAL HIGH (ref 0.60–1.30)
EGFR (African American): 43 — ABNORMAL LOW
EGFR (Non-African Amer.): 37 — ABNORMAL LOW
Glucose: 233 mg/dL — ABNORMAL HIGH (ref 65–99)
Osmolality: 279 (ref 275–301)
Potassium: 4 mmol/L (ref 3.5–5.1)
Sodium: 133 mmol/L — ABNORMAL LOW (ref 136–145)

## 2013-02-04 LAB — APTT: Activated PTT: 124.8 secs — ABNORMAL HIGH (ref 23.6–35.9)

## 2013-02-05 DIAGNOSIS — I214 Non-ST elevation (NSTEMI) myocardial infarction: Secondary | ICD-10-CM

## 2013-02-05 LAB — CBC WITH DIFFERENTIAL/PLATELET
Eosinophil #: 0.2 10*3/uL (ref 0.0–0.7)
Eosinophil %: 1.4 %
HGB: 10.3 g/dL — ABNORMAL LOW (ref 12.0–16.0)
Lymphocyte #: 1.5 10*3/uL (ref 1.0–3.6)
MCV: 88 fL (ref 80–100)
Monocyte #: 1 x10 3/mm — ABNORMAL HIGH (ref 0.2–0.9)
Monocyte %: 9 %
Neutrophil #: 8.9 10*3/uL — ABNORMAL HIGH (ref 1.4–6.5)
Neutrophil %: 76.4 %
Platelet: 499 10*3/uL — ABNORMAL HIGH (ref 150–440)
RBC: 3.55 10*6/uL — ABNORMAL LOW (ref 3.80–5.20)

## 2013-02-05 LAB — BASIC METABOLIC PANEL
Calcium, Total: 9 mg/dL (ref 8.5–10.1)
Co2: 36 mmol/L — ABNORMAL HIGH (ref 21–32)
Creatinine: 1.14 mg/dL (ref 0.60–1.30)
Potassium: 4.6 mmol/L (ref 3.5–5.1)
Sodium: 138 mmol/L (ref 136–145)

## 2013-02-11 ENCOUNTER — Encounter: Payer: Medicaid Other | Admitting: Physician Assistant

## 2013-02-14 ENCOUNTER — Encounter: Payer: Self-pay | Admitting: Cardiothoracic Surgery

## 2013-02-14 ENCOUNTER — Telehealth: Payer: Self-pay

## 2013-02-14 ENCOUNTER — Encounter: Payer: Self-pay | Admitting: Nurse Practitioner

## 2013-02-14 NOTE — Telephone Encounter (Signed)
Per Dr. Kirke Corin, pt N/S for LHC this am He would like pt r/s I called pt and LMTCB

## 2013-02-18 ENCOUNTER — Encounter: Payer: Self-pay | Admitting: Physician Assistant

## 2013-02-18 ENCOUNTER — Ambulatory Visit (INDEPENDENT_AMBULATORY_CARE_PROVIDER_SITE_OTHER): Payer: Medicaid Other | Admitting: Physician Assistant

## 2013-02-18 VITALS — BP 127/80 | HR 81 | Ht 64.0 in | Wt 199.0 lb

## 2013-02-18 DIAGNOSIS — I739 Peripheral vascular disease, unspecified: Secondary | ICD-10-CM

## 2013-02-18 DIAGNOSIS — I5032 Chronic diastolic (congestive) heart failure: Secondary | ICD-10-CM

## 2013-02-18 DIAGNOSIS — I2510 Atherosclerotic heart disease of native coronary artery without angina pectoris: Secondary | ICD-10-CM

## 2013-02-18 DIAGNOSIS — R0902 Hypoxemia: Secondary | ICD-10-CM

## 2013-02-18 DIAGNOSIS — I509 Heart failure, unspecified: Secondary | ICD-10-CM

## 2013-02-18 MED ORDER — ISOSORBIDE MONONITRATE ER 60 MG PO TB24: 90.0000 mg | ORAL_TABLET | Freq: Every day | ORAL | Status: AC

## 2013-02-18 NOTE — Assessment & Plan Note (Signed)
Resolved. Not on supplemental O2 in the office today. Breathing is much improved. Lungs clear.

## 2013-02-18 NOTE — Assessment & Plan Note (Signed)
Denies worsening SOB/DOE, LE edema, PND, orthopnea, sudden weight gain or new cough. Lungs clear on exam. No evidence of JVD. Limited extremity exam indicates improved edema from prior visits. Continue low-dose Lasix, ARB and BB.

## 2013-02-18 NOTE — Assessment & Plan Note (Addendum)
The patient has elected to defer cardiac catheterization. Her angina has improved markedly since discharge. She denies employing the use of NTG SL PRN for two weeks. She is recovering from recent RLE angioplasties and cellulitis. We revisited cardiac catheterization as an option, and she would like to continue to monitor her symptoms. Will increase Imdur to 90mg  daily as nitroglycerin is quite effective at relieving her angina. Continue Lopressor as prescribed. Alternative anti-anginal strategies in the future include cath +/- PCI, adding Norvasc, up-titrating BB, Imdur or considering Ranexa (affordability may be an obstacle). This was discussed in detail and agreed upon. She will call us if her angina increases in severity or frequency and/or if she decides she would like to proceed with cath. Continue low-dose ASA, Plavix, ARB, BB, statin, Imdur, NTG SL PRN.

## 2013-02-18 NOTE — Assessment & Plan Note (Addendum)
Healing well. Following up with vascular and wound care. She has been placed back on antibiotics to cover for cellulitis and facilitate healing. No increased leg pain, redness, swelling or fevers.

## 2013-02-18 NOTE — Patient Instructions (Addendum)
Please take Imdur as prescribed. You will take 1 and 1/2 tablets for a total of 90mg  daily.   Please continue all other medications.   Please continue previously scheduled follow-ups.   We will see you back in 2 months.

## 2013-02-18 NOTE — Progress Notes (Signed)
Patient ID: [name], female   DOB: Jun 11, 1966, 47 y.o.   MRN: [id number]            Date:  02/18/2013   ID:  [name], DOB [birth year], MRN [id number]  PCP:  Pcp Not In System  Primary Cardiologist:  M. Kirke Corin, MD   History of Present Illness: [name] is a 48 y.o. female with PMHx s/f CAD s/p CABG (11/2011 at [facility]), PVD (s/p L BKA in 09/2012 at [facility]), chronic diastolic CHF, DM2 with diabetic neuropathy/vasculopathy, HLD, asthma and depression.   She was seen in consultation by myself and Dr. Kirke Corin on 01/21/13. She was admitted to [facility] on 01/16/13 for sepsis secondary to RLE cellulitis with progressive necrosis without osteomyelitis. She improved on broad-spectrum antibiotics and eventually underwent segmental angioplasties to RLE.   At that time, her she did have a mild troponemia- the significance of this was unclear given co-occuring sepsis, acute renal failure, lack of chest pain on admission and no EKG changes, but was felt to represent demand ischemia from her active infection. She did have an episode of chest discomfort several days into the admission relieved with NTG. EKG again remained normal. There were no further episodes.   She underwent a Lexiscan Myoview which revealed LVEF 55%, moderate size reversible defect in the mid-distal anterior and anterolateral wall likely felt to represent anterior/anterolateral ischemia despite poor quality study and intense GI uptake. Options including cardiac catheterization were discussed with the patient who elected to defer. Medical management was pursued.   She has recently complained of worsening chest discomfort. Given her cardiovascular history, significant cardiac risk factors and abnormal stress test, the plan was made to proceed with diagnostic cardiac catheterization last week for which she did not show. She presents back today for follow-up.   She denies any chest pain x 2 weeks. She has not had to use additional NTG SL PRN. She  continues to take Imdur and Lopressor as prescribed. She has attended several follow-up appointments including vascular, wound care and even established with a PCP in [place]. She reports not wanting cardiac catheterization. She feels better and is making strides in attempting to heal her legs. There is an ulceration on her L stump that is close to being healed. She was recently placed back on antibiotics for an extended duration by vascular for empiric RLE cellulitis coverage. She reports improved redness and pain to her RLE. She denies SOB/DOE, PND, orthopnea, lightheadedness, palpitations or syncope. Breathing well off oxygen.   EKG: NSR, Q waves V1, V2, subtle (< 1mm) ST depressions with TWIs I, aVL  Wt Readings from Last 3 Encounters:  02/18/13 199 lb (90.266 kg)  02/01/13 205 lb (92.987 kg)     Past Medical History  Diagnosis Date  . Diabetes mellitus   . Diabetic neuropathy   . Depression   . Hyperlipidemia   . Asthma   . Peripheral artery disease     s/p LLE amputation 2013; segmental angioplasties to RLE 01/23/13  . Chronic diastolic CHF (congestive heart failure)   . Coronary artery disease     a. CABG 11/2011 at [facility] b. NSTEMI 01/2013- nuc stress test w/ moderate anterior reversible ischemia, EF 55%, medically managed  . MI (myocardial infarction)   . Hypertension     Current Outpatient Prescriptions  Medication Sig Dispense Refill  . albuterol (PROVENTIL HFA;VENTOLIN HFA) 108 (90 BASE) MCG/ACT inhaler Inhale 2 puffs into the lungs as needed for wheezing.      Marland Kitchen  famotidine (PEPCID) 20 MG tablet Take 20 mg by mouth 2 (two) times daily.      . furosemide (LASIX) 20 MG tablet Take 20 mg by mouth 2 (two) times daily.      Marland Kitchen gabapentin (NEURONTIN) 600 MG tablet Take 600 mg by mouth 3 (three) times daily.      Marland Kitchen glucose blood test strip 1 each by Other route as needed for other. Use as instructed      . Lancets (FREESTYLE) lancets 1 each by Other route as needed for other. Use as  instructed      . losartan (COZAAR) 25 MG tablet Take 25 mg by mouth daily.      . metoprolol (LOPRESSOR) 50 MG tablet Take 50 mg by mouth 2 (two) times daily.      . traZODone (DESYREL) 50 MG tablet Take 50 mg by mouth as needed.       Marland Kitchen aspirin 81 MG tablet Take 81 mg by mouth daily.      Marland Kitchen atorvastatin (LIPITOR) 40 MG tablet Take 40 mg by mouth daily.      . clopidogrel (PLAVIX) 75 MG tablet Take 75 mg by mouth daily.      Marland Kitchen docusate sodium (COLACE) 100 MG capsule Take 100 mg by mouth 3 (three) times daily.      . DULoxetine (CYMBALTA) 60 MG capsule Take 60 mg by mouth daily.      . ferrous sulfate 325 (65 FE) MG tablet Take 325 mg by mouth daily with breakfast.      . insulin regular (NOVOLIN R,HUMULIN R) 100 units/mL injection Inject 20 Units into the skin 2 (two) times daily before a meal.       . isosorbide mononitrate (IMDUR) 60 MG 24 hr tablet Take 60 mg by mouth daily.      . nitroGLYCERIN (NITROSTAT) 0.4 MG SL tablet Place 1 tablet (0.4 mg total) under the tongue every 5 (five) minutes as needed for chest pain.  25 tablet  3  . oxyCODONE (OXY IR/ROXICODONE) 5 MG immediate release tablet Take 5 mg by mouth as needed for pain.       No current facility-administered medications for this visit.    Allergies:    Allergies  Allergen Reactions  . Accupril (Quinapril Hcl)   . Bactrim (Sulfamethoxazole-Tmp Ds)     Social History:  The patient  reports that she has quit smoking. Her smoking use included Cigarettes. She has a 5 pack-year smoking history. She does not have any smokeless tobacco history on file. She reports that she does not drink alcohol or use illicit drugs.   ROS:  Please see the history of present illness.  All other systems reviewed and negative.   PHYSICAL EXAM: VS:  BP 127/80  Pulse 81  Ht 5\' 4"  (1.626 m)  Wt 199 lb (90.266 kg)  BMI 34.14 kg/m2 Well nourished, well developed, in no acute distress HEENT: normal Neck: no JVD Cardiac:  normal S1, S2; RRR; no  murmur Lungs:  clear to auscultation bilaterally, no wheezing, rhonchi or rales Abd: soft, nontender, no hepatomegaly Ext: limited exam due to RLE and LLE wound wraps. No appreciable edema by light palpation. Mild tenderness to palpation of RLE.  Skin: warm and dry Neuro:  CNs 2-12 intact, no focal abnormalities noted

## 2013-02-19 NOTE — Telephone Encounter (Signed)
Pt came in for ov 5/19

## 2013-03-03 ENCOUNTER — Encounter: Payer: Self-pay | Admitting: Nurse Practitioner

## 2013-03-15 ENCOUNTER — Encounter: Payer: Self-pay | Admitting: Cardiothoracic Surgery

## 2013-04-01 ENCOUNTER — Inpatient Hospital Stay: Payer: Self-pay | Admitting: Student

## 2013-04-01 LAB — CBC WITH DIFFERENTIAL/PLATELET
Basophil #: 0.1 10*3/uL (ref 0.0–0.1)
Basophil %: 0.4 %
Eosinophil #: 0.2 10*3/uL (ref 0.0–0.7)
Eosinophil %: 1.6 %
HCT: 35.9 % (ref 35.0–47.0)
HGB: 12.3 g/dL (ref 12.0–16.0)
Lymphocyte %: 15.1 %
MCH: 30.2 pg (ref 26.0–34.0)
MCHC: 34.3 g/dL (ref 32.0–36.0)
MCV: 88 fL (ref 80–100)
Monocyte %: 7.4 %
Neutrophil #: 9.8 10*3/uL — ABNORMAL HIGH (ref 1.4–6.5)
Neutrophil %: 75.5 %
Platelet: 333 10*3/uL (ref 150–440)
RBC: 4.07 10*6/uL (ref 3.80–5.20)
RDW: 16 % — ABNORMAL HIGH (ref 11.5–14.5)
WBC: 12.9 10*3/uL — ABNORMAL HIGH (ref 3.6–11.0)

## 2013-04-01 LAB — COMPREHENSIVE METABOLIC PANEL
Albumin: 1.7 g/dL — ABNORMAL LOW (ref 3.4–5.0)
Alkaline Phosphatase: 146 U/L — ABNORMAL HIGH (ref 50–136)
Anion Gap: 7 (ref 7–16)
BUN: 19 mg/dL — ABNORMAL HIGH (ref 7–18)
Bilirubin,Total: 0.5 mg/dL (ref 0.2–1.0)
Calcium, Total: 8.8 mg/dL (ref 8.5–10.1)
Creatinine: 0.84 mg/dL (ref 0.60–1.30)
EGFR (Non-African Amer.): >60
Glucose: 220 mg/dL — ABNORMAL HIGH (ref 65–99)
Osmolality: 270 (ref 275–301)
SGOT(AST): 19 U/L (ref 15–37)
Sodium: 130 mmol/L — ABNORMAL LOW (ref 136–145)
Total Protein: 7.5 g/dL (ref 6.4–8.2)

## 2013-04-02 ENCOUNTER — Encounter: Payer: Self-pay | Admitting: Nurse Practitioner

## 2013-04-02 ENCOUNTER — Encounter: Payer: Self-pay | Admitting: Cardiothoracic Surgery

## 2013-04-02 DIAGNOSIS — I2510 Atherosclerotic heart disease of native coronary artery without angina pectoris: Secondary | ICD-10-CM

## 2013-04-02 DIAGNOSIS — Z01810 Encounter for preprocedural cardiovascular examination: Secondary | ICD-10-CM

## 2013-04-02 DIAGNOSIS — I739 Peripheral vascular disease, unspecified: Secondary | ICD-10-CM

## 2013-04-02 LAB — COMPREHENSIVE METABOLIC PANEL
Anion Gap: 6 — ABNORMAL LOW (ref 7–16)
Calcium, Total: 8.9 mg/dL (ref 8.5–10.1)
Chloride: 103 mmol/L (ref 98–107)
Co2: 30 mmol/L (ref 21–32)
Glucose: 194 mg/dL — ABNORMAL HIGH (ref 65–99)
Osmolality: 284 (ref 275–301)
SGPT (ALT): 15 U/L (ref 12–78)
Sodium: 139 mmol/L (ref 136–145)

## 2013-04-02 LAB — CBC WITH DIFFERENTIAL/PLATELET
Basophil #: 0.1 10*3/uL (ref 0.0–0.1)
HCT: 35 % (ref 35.0–47.0)
Lymphocyte #: 2 10*3/uL (ref 1.0–3.6)
Lymphocyte %: 18.8 %
MCH: 30.3 pg (ref 26.0–34.0)
MCHC: 34.2 g/dL (ref 32.0–36.0)
MCV: 89 fL (ref 80–100)
Monocyte #: 1 x10 3/mm — ABNORMAL HIGH (ref 0.2–0.9)
Monocyte %: 9.6 %
Neutrophil #: 7.2 10*3/uL — ABNORMAL HIGH (ref 1.4–6.5)
Neutrophil %: 68.2 %
RBC: 3.94 10*6/uL (ref 3.80–5.20)
RDW: 16 % — ABNORMAL HIGH (ref 11.5–14.5)

## 2013-04-03 LAB — VANCOMYCIN, TROUGH: Vancomycin, Trough: 16 ug/mL (ref 10–20)

## 2013-04-03 LAB — BASIC METABOLIC PANEL WITH GFR
Anion Gap: 6 — ABNORMAL LOW (ref 7–16)
BUN: 15 mg/dL (ref 7–18)
Calcium, Total: 8.8 mg/dL (ref 8.5–10.1)
Chloride: 105 mmol/L (ref 98–107)
Co2: 28 mmol/L (ref 21–32)
Creatinine: 1.05 mg/dL (ref 0.60–1.30)
EGFR (African American): >60
EGFR (Non-African Amer.): >60
Glucose: 96 mg/dL (ref 65–99)
Osmolality: 278 (ref 275–301)
Potassium: 4.4 mmol/L (ref 3.5–5.1)
Sodium: 139 mmol/L (ref 136–145)

## 2013-04-03 LAB — CBC WITH DIFFERENTIAL/PLATELET
Basophil #: 0.1 x10 3/mm 3 (ref 0.0–0.1)
Basophil %: 0.5 %
Eosinophil #: 0.3 x10 3/mm 3 (ref 0.0–0.7)
Eosinophil %: 3.2 %
HCT: 31.3 % — ABNORMAL LOW (ref 35.0–47.0)
HGB: 10.9 g/dL — ABNORMAL LOW (ref 12.0–16.0)
Lymphocyte %: 22.9 %
Lymphs Abs: 2.4 x10 3/mm 3 (ref 1.0–3.6)
MCH: 31 pg (ref 26.0–34.0)
MCHC: 34.8 g/dL (ref 32.0–36.0)
MCV: 89 fL (ref 80–100)
Monocyte #: 0.8 x10 3/mm (ref 0.2–0.9)
Monocyte %: 7.6 %
Neutrophil #: 7.1 x10 3/mm 3 — ABNORMAL HIGH (ref 1.4–6.5)
Neutrophil %: 65.8 %
Platelet: 308 x10 3/mm 3 (ref 150–440)
RBC: 3.51 X10 6/mm 3 — ABNORMAL LOW (ref 3.80–5.20)
RDW: 16.1 % — ABNORMAL HIGH (ref 11.5–14.5)
WBC: 10.7 x10 3/mm 3 (ref 3.6–11.0)

## 2013-04-05 LAB — HEMOGLOBIN: HGB: 10.1 g/dL — ABNORMAL LOW (ref 12.0–16.0)

## 2013-04-06 LAB — CULTURE, BLOOD (SINGLE)

## 2013-04-23 ENCOUNTER — Ambulatory Visit: Payer: Medicaid Other | Admitting: Cardiovascular Disease

## 2013-05-03 ENCOUNTER — Encounter: Payer: Self-pay | Admitting: Nurse Practitioner

## 2013-05-03 ENCOUNTER — Encounter: Payer: Self-pay | Admitting: Cardiothoracic Surgery

## 2013-05-06 ENCOUNTER — Ambulatory Visit: Payer: Self-pay | Admitting: Vascular Surgery

## 2013-05-06 LAB — BASIC METABOLIC PANEL
Anion Gap: 0 — ABNORMAL LOW (ref 7–16)
BUN: 30 mg/dL — ABNORMAL HIGH (ref 7–18)
Calcium, Total: 8.9 mg/dL (ref 8.5–10.1)
Chloride: 105 mmol/L (ref 98–107)
Co2: 33 mmol/L — ABNORMAL HIGH (ref 21–32)
EGFR (Non-African Amer.): 54 — ABNORMAL LOW
Glucose: 179 mg/dL — ABNORMAL HIGH (ref 65–99)
Osmolality: 286 (ref 275–301)
Potassium: 4.6 mmol/L (ref 3.5–5.1)
Sodium: 138 mmol/L (ref 136–145)

## 2013-05-09 ENCOUNTER — Ambulatory Visit: Payer: Medicaid Other | Admitting: Cardiovascular Disease

## 2013-05-13 ENCOUNTER — Ambulatory Visit: Payer: Self-pay | Admitting: Vascular Surgery

## 2013-05-13 LAB — BASIC METABOLIC PANEL
Anion Gap: 3 — ABNORMAL LOW (ref 7–16)
BUN: 23 mg/dL — ABNORMAL HIGH (ref 7–18)
Chloride: 101 mmol/L (ref 98–107)
Co2: 31 mmol/L (ref 21–32)
Creatinine: 0.88 mg/dL (ref 0.60–1.30)
EGFR (African American): >60
EGFR (Non-African Amer.): >60
Glucose: 113 mg/dL — ABNORMAL HIGH (ref 65–99)
Potassium: 4.4 mmol/L (ref 3.5–5.1)
Sodium: 135 mmol/L — ABNORMAL LOW (ref 136–145)

## 2013-05-13 LAB — CBC
HCT: 39 % (ref 35.0–47.0)
MCH: 30.7 pg (ref 26.0–34.0)
MCHC: 34.4 g/dL (ref 32.0–36.0)
MCV: 89 fL (ref 80–100)
Platelet: 368 10*3/uL (ref 150–440)
RBC: 4.37 10*6/uL (ref 3.80–5.20)
WBC: 15 10*3/uL — ABNORMAL HIGH (ref 3.6–11.0)

## 2013-05-13 LAB — HCG, QUANTITATIVE, PREGNANCY: Beta Hcg, Quant.: <1 m[IU]/mL — ABNORMAL LOW

## 2013-05-15 ENCOUNTER — Ambulatory Visit: Payer: Self-pay | Admitting: Vascular Surgery

## 2013-05-28 ENCOUNTER — Ambulatory Visit: Payer: Medicaid Other | Admitting: Cardiovascular Disease

## 2013-06-02 LAB — WOUND AEROBIC CULTURE

## 2013-06-03 ENCOUNTER — Encounter: Payer: Self-pay | Admitting: Cardiothoracic Surgery

## 2013-06-03 HISTORY — PX: AMPUTATION OF REPLICATED TOES: SHX1136

## 2013-06-13 ENCOUNTER — Telehealth: Payer: Self-pay | Admitting: *Deleted

## 2013-06-13 DIAGNOSIS — I25810 Atherosclerosis of coronary artery bypass graft(s) without angina pectoris: Secondary | ICD-10-CM

## 2013-06-13 NOTE — Telephone Encounter (Signed)
[facility] called and the patient is seeing Dr. Kirke Corin on 06/17/13 and needs clearance hyperbaric treatment and needs an echo. Please advise nurse

## 2013-06-13 NOTE — Telephone Encounter (Signed)
Spoke w/ Rosalio Macadamia [facility]  She wanted to go ahead and get an appt for an ECHO for pt's upcoming hyperbaric treatment.  Pt sched for echo here in [place] office 07/05/13 @ 10:00. Rosalio Macadamia will contact pt about appt time.

## 2013-06-14 ENCOUNTER — Ambulatory Visit: Payer: Self-pay | Admitting: Surgery

## 2013-06-17 ENCOUNTER — Ambulatory Visit: Payer: Medicaid Other | Admitting: Cardiovascular Disease

## 2013-06-25 ENCOUNTER — Ambulatory Visit: Payer: Medicaid Other | Admitting: Cardiovascular Disease

## 2013-06-25 ENCOUNTER — Encounter: Payer: Self-pay | Admitting: *Deleted

## 2013-07-03 ENCOUNTER — Encounter: Payer: Self-pay | Admitting: Cardiothoracic Surgery

## 2013-07-04 ENCOUNTER — Ambulatory Visit (INDEPENDENT_AMBULATORY_CARE_PROVIDER_SITE_OTHER): Payer: Medicaid Other | Admitting: Cardiovascular Disease

## 2013-07-04 ENCOUNTER — Encounter: Payer: Self-pay | Admitting: Cardiovascular Disease

## 2013-07-04 VITALS — BP 132/68 | HR 74 | Ht 64.0 in | Wt 217.8 lb

## 2013-07-04 DIAGNOSIS — I5032 Chronic diastolic (congestive) heart failure: Secondary | ICD-10-CM

## 2013-07-04 DIAGNOSIS — I509 Heart failure, unspecified: Secondary | ICD-10-CM

## 2013-07-04 DIAGNOSIS — I739 Peripheral vascular disease, unspecified: Secondary | ICD-10-CM

## 2013-07-04 DIAGNOSIS — I2510 Atherosclerotic heart disease of native coronary artery without angina pectoris: Secondary | ICD-10-CM

## 2013-07-04 NOTE — Assessment & Plan Note (Signed)
She appears to be euvolemic on current dose of Lasix. She is going to have an echocardiogram done tomorrow.

## 2013-07-04 NOTE — Progress Notes (Signed)
HPI  This is a 47 year old female who is here for a follow up visit. She has PMHx s/f CAD s/p CABG (11/2011 at [facility]), PVD (s/p L BKA in 09/2012 at [facility]), chronic diastolic CHF, DM2 with diabetic neuropathy/vasculopathy, HLD, asthma and depression.   She was seen in consultation on 01/21/13. She was admitted to [facility] on 01/16/13 for sepsis secondary to RLE cellulitis with progressive necrosis without osteomyelitis. She improved on broad-spectrum antibiotics and eventually underwent segmental angioplasties to RLE.   At that time, her she did have a mild troponemia- the significance of this was unclear given co-occuring sepsis, acute renal failure, lack of chest pain on admission and no EKG changes, but was felt to represent demand ischemia from her active infection. She did have an episode of chest discomfort several days into the admission relieved with NTG. EKG again remained normal. There were no further episodes.   She underwent a Lexiscan Myoview which revealed LVEF 55%, moderate size reversible defect in the mid-distal anterior and anterolateral wall likely felt to represent anterior/anterolateral ischemia despite poor quality study and intense GI uptake. Options including cardiac catheterization were discussed with the patient who elected to defer. Medical management was pursued.  She is doing reasonably well. Chest pain has been under control. On average, she only has used sublingual nitroglycerin once a month. Right leg wound seems to be healing slowly. Unfortunately, she continues to smoke a few cigarettes a day.    Allergies  Allergen Reactions  . Accupril [Quinapril Hcl]   . Bactrim [Sulfamethoxazole-Tmp Ds]      Current Outpatient Prescriptions on File Prior to Visit  Medication Sig Dispense Refill  . albuterol (PROVENTIL HFA;VENTOLIN HFA) 108 (90 BASE) MCG/ACT inhaler Inhale 2 puffs into the lungs as needed for wheezing.      Marland Kitchen aspirin 81 MG tablet Take 81 mg by mouth daily.      Marland Kitchen  atorvastatin (LIPITOR) 40 MG tablet Take 40 mg by mouth daily.      . clopidogrel (PLAVIX) 75 MG tablet Take 75 mg by mouth daily.      Marland Kitchen docusate sodium (COLACE) 100 MG capsule Take 100 mg by mouth 3 (three) times daily.      . famotidine (PEPCID) 20 MG tablet Take 20 mg by mouth 2 (two) times daily.      . ferrous sulfate 325 (65 FE) MG tablet Take 325 mg by mouth daily with breakfast.      . gabapentin (NEURONTIN) 600 MG tablet Take 600 mg by mouth 3 (three) times daily.      Marland Kitchen glucose blood test strip 1 each by Other route as needed for other. Use as instructed      . insulin regular (NOVOLIN R,HUMULIN R) 100 units/mL injection Inject 20 Units into the skin 2 (two) times daily before a meal.       . isosorbide mononitrate (IMDUR) 60 MG 24 hr tablet Take 1.5 tablets (90 mg total) by mouth daily.      . Lancets (FREESTYLE) lancets 1 each by Other route as needed for other. Use as instructed      . metoprolol (LOPRESSOR) 50 MG tablet Take 50 mg by mouth 2 (two) times daily.      . nitroGLYCERIN (NITROSTAT) 0.4 MG SL tablet Place 1 tablet (0.4 mg total) under the tongue every 5 (five) minutes as needed for chest pain.  25 tablet  3  . oxyCODONE (OXY IR/ROXICODONE) 5 MG immediate release tablet Take  5 mg by mouth as needed for pain.      . furosemide (LASIX) 20 MG tablet Take 20 mg by mouth 2 (two) times daily.      Marland Kitchen losartan (COZAAR) 25 MG tablet Take 25 mg by mouth daily.       No current facility-administered medications on file prior to visit.     Past Medical History  Diagnosis Date  . Diabetes mellitus   . Diabetic neuropathy   . Depression   . Hyperlipidemia   . Asthma   . Peripheral artery disease     s/p LLE amputation 2013; segmental angioplasties to RLE 01/23/13  . Chronic diastolic CHF (congestive heart failure)   . Coronary artery disease     a. CABG 11/2011 at [facility] b. NSTEMI 01/2013- nuc stress test w/ moderate anterior reversible ischemia, EF 55%, medically managed  . MI  (myocardial infarction)   . Hypertension      Past Surgical History  Procedure Laterality Date  . Below knee leg amputation Left   . Cardiac catheterization  2012    [facility]  . Coronary artery bypass graft    . 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 Hypertension Mother      History   Social History  . Marital Status: Unknown    Spouse Name: N/A    Number of Children: N/A  . Years of Education: N/A   Occupational History  . Not on file.   Social History Main Topics  . Smoking status: Current Some Day Smoker -- 0.50 packs/day for 10 years    Types: Cigarettes  . Smokeless tobacco: Not on file  . Alcohol Use: No  . Drug Use: No  . Sexual Activity: Not on file   Other Topics Concern  . Not on file   Social History Narrative  . No narrative on file     PHYSICAL EXAM   BP 132/68  Pulse 74  Ht 5\' 4"  (1.626 m)  Wt 217 lb 12.8 oz (98.793 kg)  BMI 37.37 kg/m2 Constitutional: She is oriented to person, place, and time. She appears well-developed and well-nourished. No distress.  HENT: No nasal discharge.  Head: Normocephalic and atraumatic.  Eyes: Pupils are equal and round. Right eye exhibits no discharge. Left eye exhibits no discharge.  Neck: Normal range of motion. Neck supple. No JVD present. No thyromegaly present.  Cardiovascular: Normal rate, regular rhythm, normal heart sounds. Exam reveals no gallop and no friction rub. No murmur heard.  Pulmonary/Chest: Effort normal and breath sounds normal. No stridor. No respiratory distress. She has no wheezes. She has no rales. She exhibits no tenderness.  Abdominal: Soft. Bowel sounds are normal. She exhibits no distension. There is no tenderness. There is no rebound and no guarding.  Musculoskeletal: Left below the knee amputation  Neurological: She is alert and oriented to person, place, and time. Coordination normal.  Skin: Skin is warm and dry. No rash  noted. She is not diaphoretic. No erythema. No pallor.  Psychiatric: She has a normal mood and affect. Her behavior is normal. Judgment and thought content normal.      ASSESSMENT AND PLAN

## 2013-07-04 NOTE — Assessment & Plan Note (Signed)
Angina has been well-controlled with medications. Most recent stress test showed possible anterior wall ischemia with normal ejection fraction. Continue medical therapy for now with close monitoring.

## 2013-07-04 NOTE — Assessment & Plan Note (Signed)
I had a prolonged discussion with her about the importance of smoking cessation in order to prevent further progression on the right side.  PAD is being managed by Dr. Lorretta Harp.

## 2013-07-04 NOTE — Patient Instructions (Addendum)
Continue same medications.   Your physician wants you to follow-up in: 6 months.  You will receive a reminder letter in the mail two months in advance. If you don't receive a letter, please call our office to schedule the follow-up appointment.  

## 2013-07-05 ENCOUNTER — Other Ambulatory Visit (INDEPENDENT_AMBULATORY_CARE_PROVIDER_SITE_OTHER): Payer: Medicaid Other

## 2013-07-05 ENCOUNTER — Other Ambulatory Visit: Payer: Self-pay

## 2013-07-05 DIAGNOSIS — I509 Heart failure, unspecified: Secondary | ICD-10-CM

## 2013-07-05 DIAGNOSIS — I2510 Atherosclerotic heart disease of native coronary artery without angina pectoris: Secondary | ICD-10-CM

## 2013-07-05 DIAGNOSIS — I25810 Atherosclerosis of coronary artery bypass graft(s) without angina pectoris: Secondary | ICD-10-CM

## 2013-07-09 ENCOUNTER — Telehealth: Payer: Self-pay

## 2013-07-09 NOTE — Telephone Encounter (Signed)
Pt would like echo results. Please call. 

## 2013-07-09 NOTE — Telephone Encounter (Signed)
We will all pt with results after they have been interpreted.

## 2013-07-10 ENCOUNTER — Telehealth: Payer: Self-pay

## 2013-07-10 NOTE — Telephone Encounter (Signed)
Spoke w/ pt.  She is aware of results.  

## 2013-07-10 NOTE — Telephone Encounter (Signed)
Message copied by Marilynne Halsted on Wed Jul 10, 2013  9:16 AM ------      Message from: Lorine Bears A      Created: Tue Jul 09, 2013  5:37 PM       Inform patient that echo was fine. ------

## 2013-08-03 ENCOUNTER — Encounter: Payer: Self-pay | Admitting: Cardiothoracic Surgery

## 2013-10-09 ENCOUNTER — Ambulatory Visit: Payer: Self-pay | Admitting: Ophthalmology

## 2014-01-20 IMAGING — CR DG CHEST 1V PORT
1 series · 1 of 1 positions shown · non-contrast
Comparison: none

REASON FOR EXAM: Chest Pain
COMMENTS:

PROCEDURE:     DXR - DXR PORTABLE CHEST SINGLE VIEW  - January 26, 2013  [DATE]
RESULT:     Comparison: None

[ap]
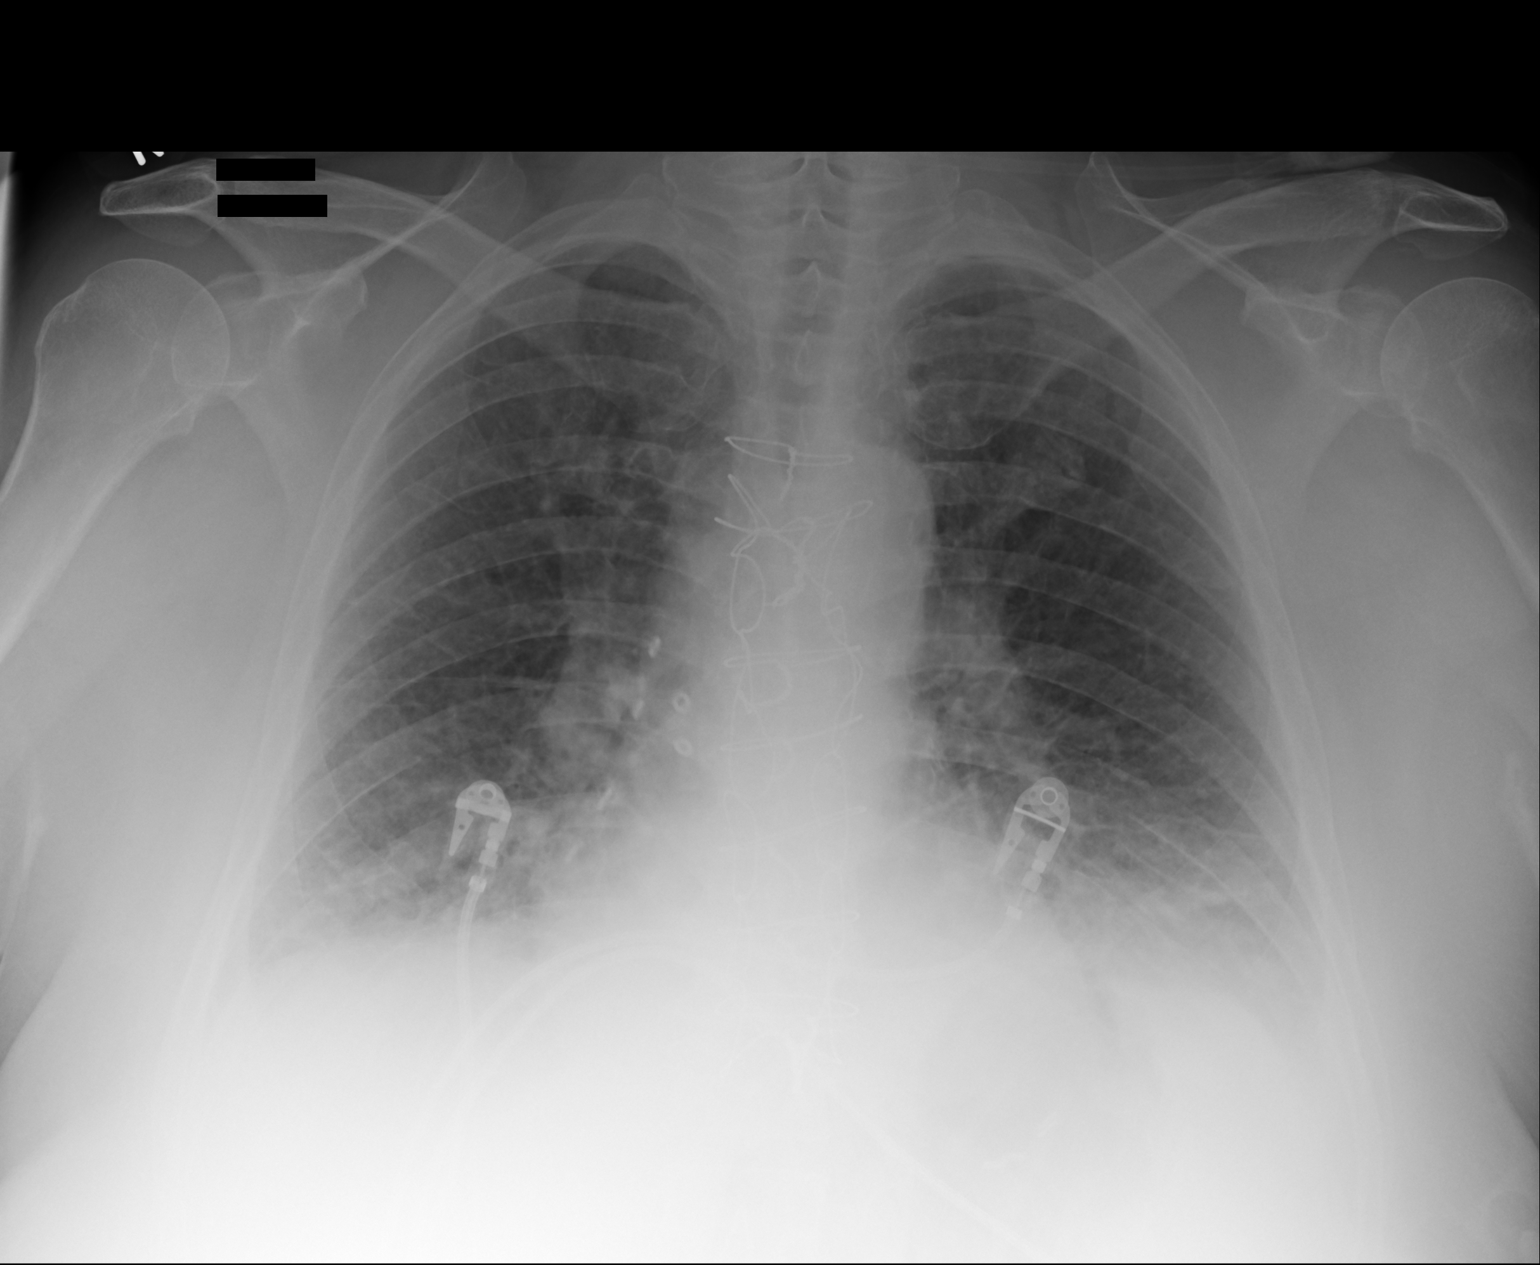

[1 of 1 positions shown; findings below may reference images not displayed]

FINDINGS: Single portable AP chest radiograph is provided. There is bilateral diffuse
interstitial thickening likely representing interstitial edema versus
interstitial pneumonitis secondary to an infectious or inflammatory
etiology. There bilateral small pleural effusions. There is no pneumothorax.
Prior CABG. Normal cardiomediastinal silhouette. The osseous structures are
unremarkable.
IMPRESSION: There is bilateral diffuse interstitial thickening likely representing
interstitial edema versus interstitial pneumonitis secondary to an
infectious or inflammatory etiology.

[REDACTED]

## 2014-01-20 IMAGING — CT CT HEAD WITHOUT CONTRAST
2 series · 15 of 30 positions shown, 19 images · non-contrast
Comparison: none

REASON FOR EXAM: acute change in mental status in ed.
COMMENTS:

PROCEDURE:     CT  - CT HEAD WITHOUT CONTRAST  - January 26, 2013  [DATE]
RESULT:     Comparison:  None
TECHNIQUE: Multiple axial images from the foramen magnum to the vertex were
obtained without IV contrast.

[Series 2: without · axial · non-contrast · 0.43mm/px · z∈[+1232,+1362]mm · 13 of 32 slices shown, 17 images]
[im 3/32  brain]
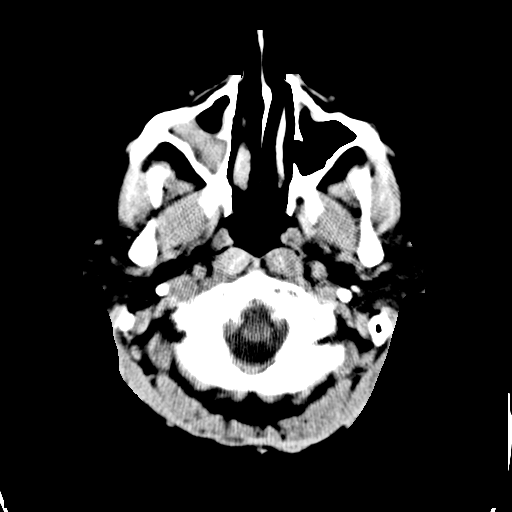
[im 3/32  bone]
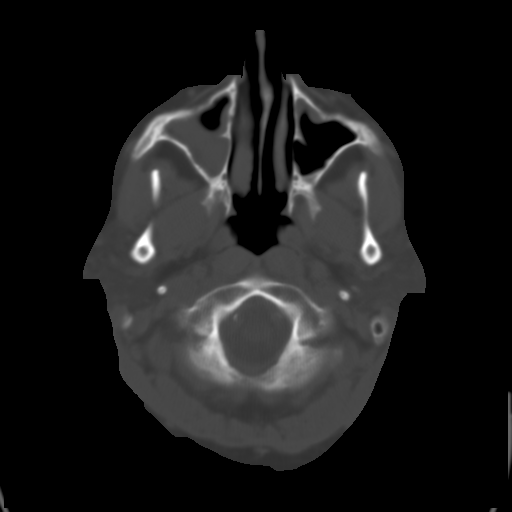
[im 5/32  brain]
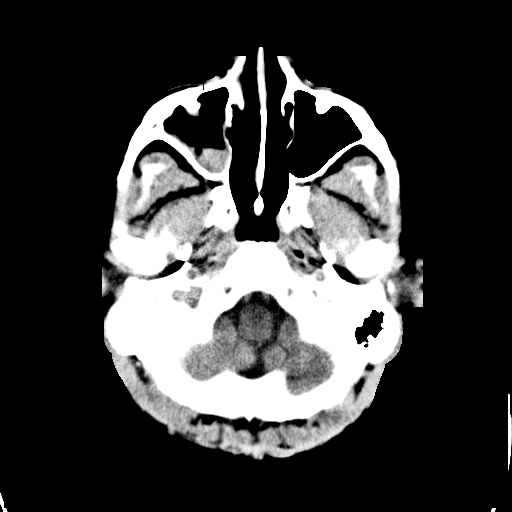
[im 7/32  brain]
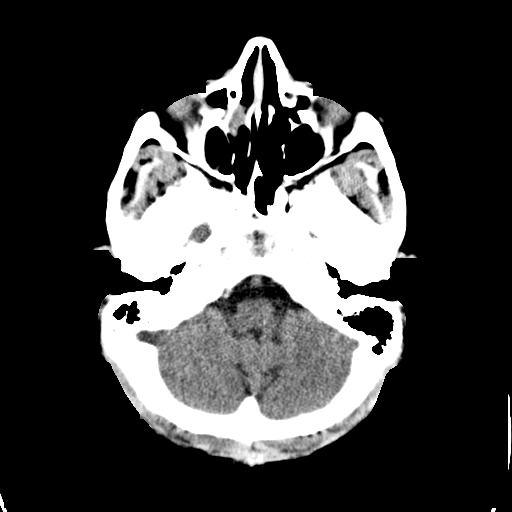
[im 9/32  brain]
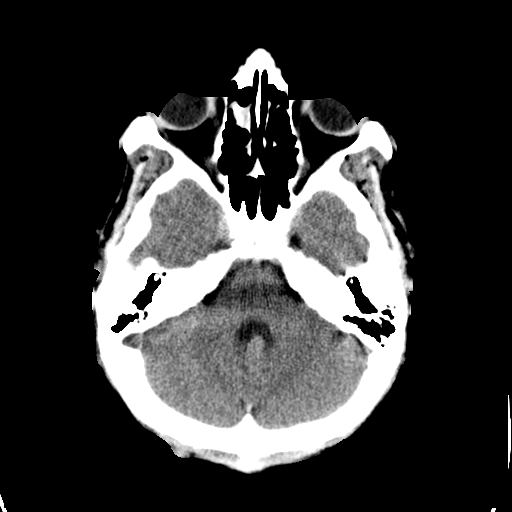
[im 12/32  brain]
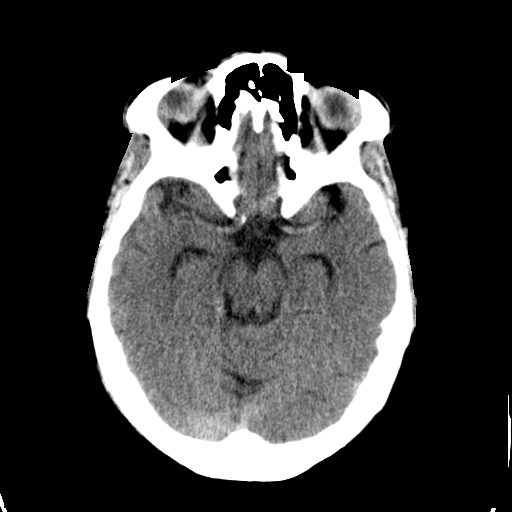
[im 12/32  bone]
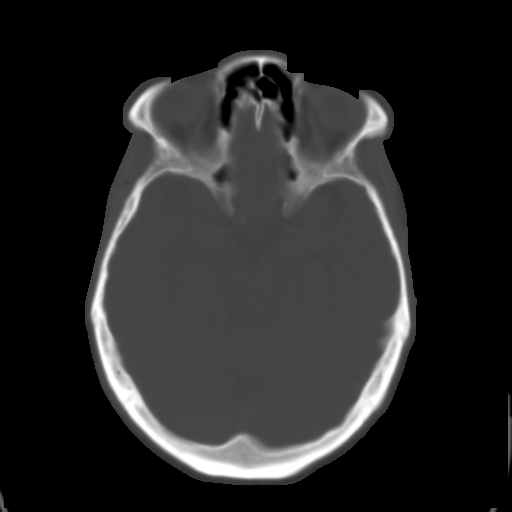
[im 14/32  brain]
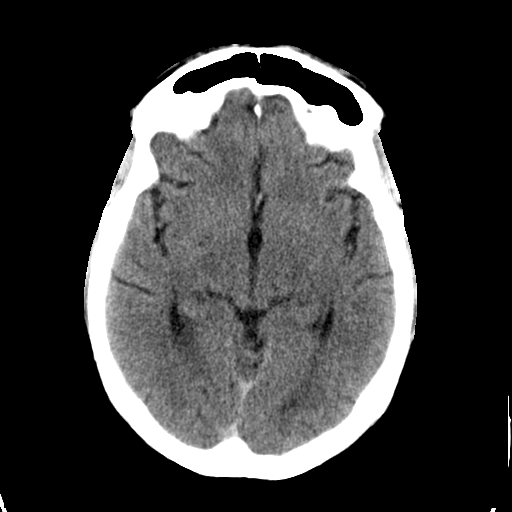
[im 16/32  brain]
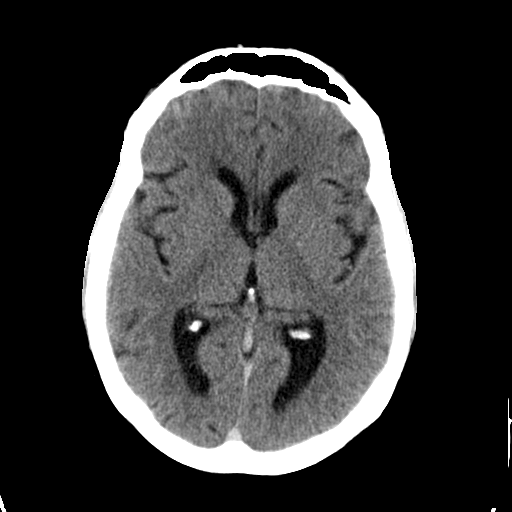
[im 18/32  brain]
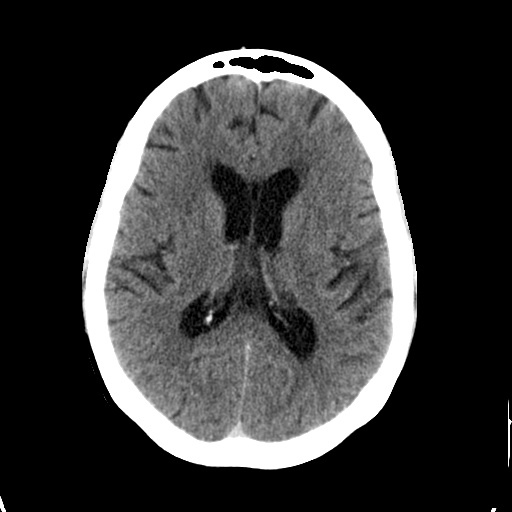
[im 20/32  brain]
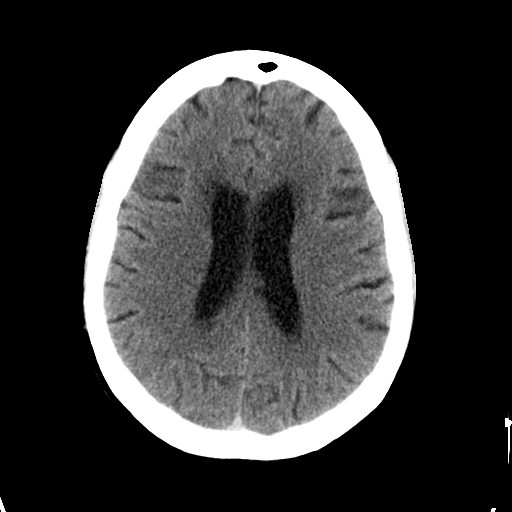
[im 20/32  bone]
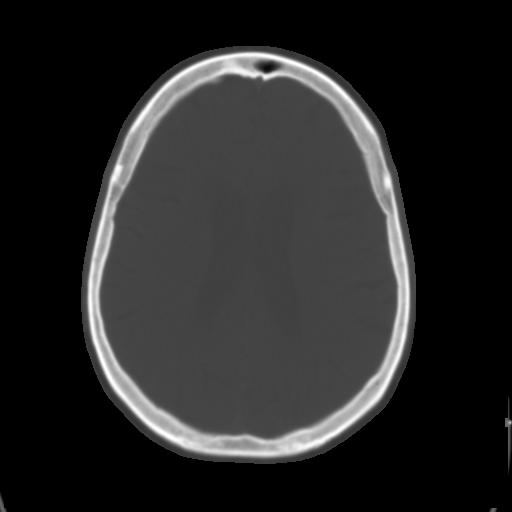
[im 23/32  brain]
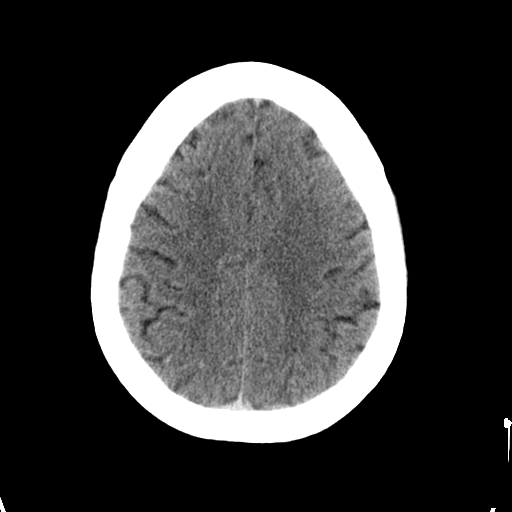
[im 25/32  brain]
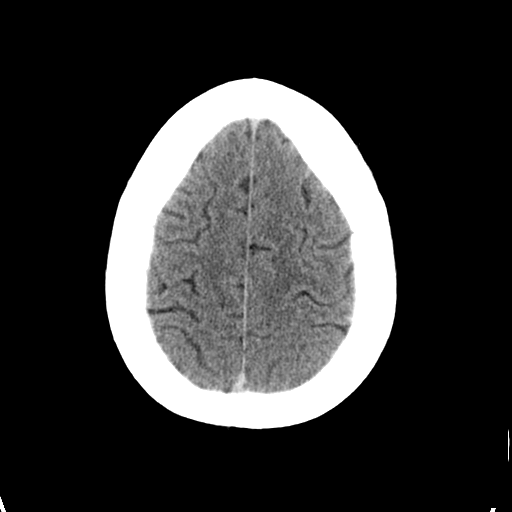
[im 27/32  brain]
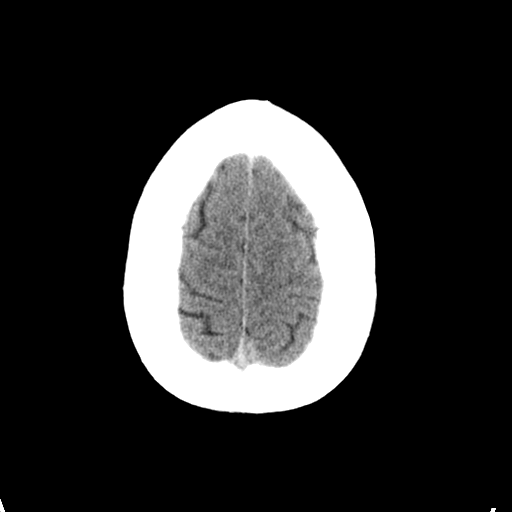
[im 29/32  brain]
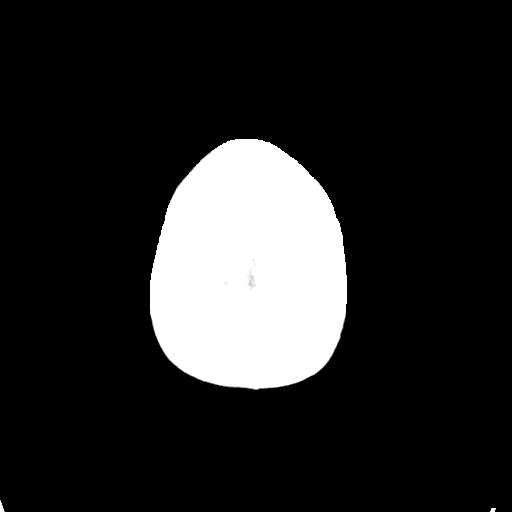
[im 29/32  bone]
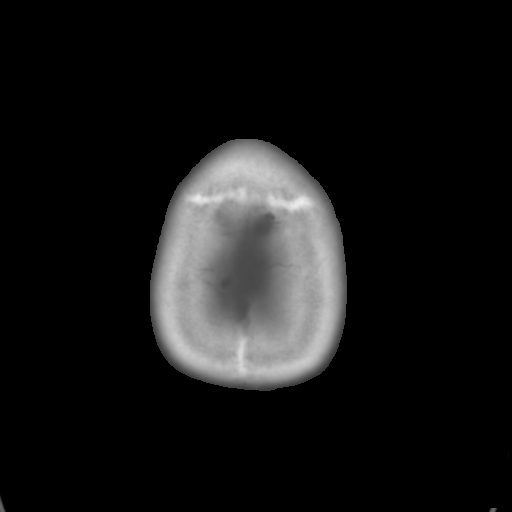

[Series 3: bone · axial · 0.43mm/px · z∈[+1232,+1252]mm · 2 of 32 slices shown]
[im 3/32  bone]
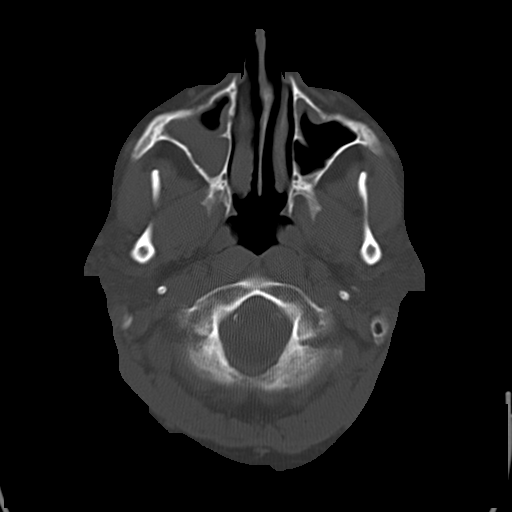
[im 7/32  bone]
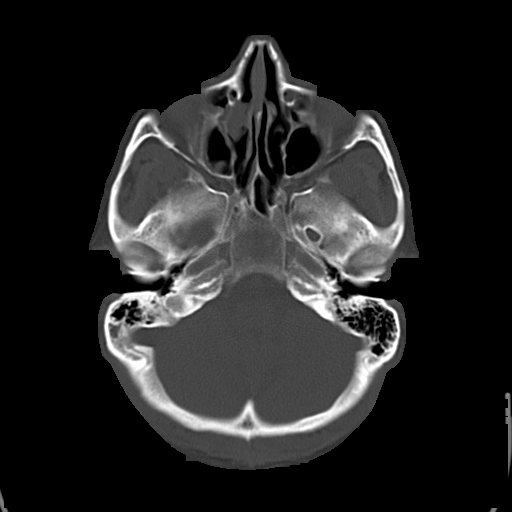

[15 of 30 positions shown; findings below may reference images not displayed]

FINDINGS: There is no evidence of mass effect, midline shift, or extra-axial fluid
collections.  There is no evidence of a space-occupying lesion or
intracranial hemorrhage. There is no evidence of a cortical-based area of
acute infarction.  There is periventricular white matter low attenuation
likely secondary to microangiopathy.

The ventricles and sulci are appropriate for the patient's age. The basal
cisterns are patent.

Visualized portions of the orbits are unremarkable. There is right maxillary
sinus mucosal thickening. Cerebrovascular atherosclerotic calcifications are
noted.

The osseous structures are unremarkable.
IMPRESSION: No acute intracranial process.

[REDACTED]

## 2014-01-26 IMAGING — CR DG CHEST 2V
1 series · 2 of 2 positions shown · non-contrast
Comparison: none

REASON FOR EXAM: Shortness of Breath
COMMENTS:   May transport without cardiac monitor

PROCEDURE:     DXR - DXR CHEST PA (OR AP) AND LATERAL  - February 01, 2013 [DATE]
RESULT:     Cardiomegaly. Prior CABG. Pulmonary vascular prominence.
Interstitial prominence. Small pleural effusions. Findings have progressed
slightly from 01/26/2013.

[Series 1: w chest lat · 0.14mm/px · 2 of 2 slices shown]
[im 1/2]
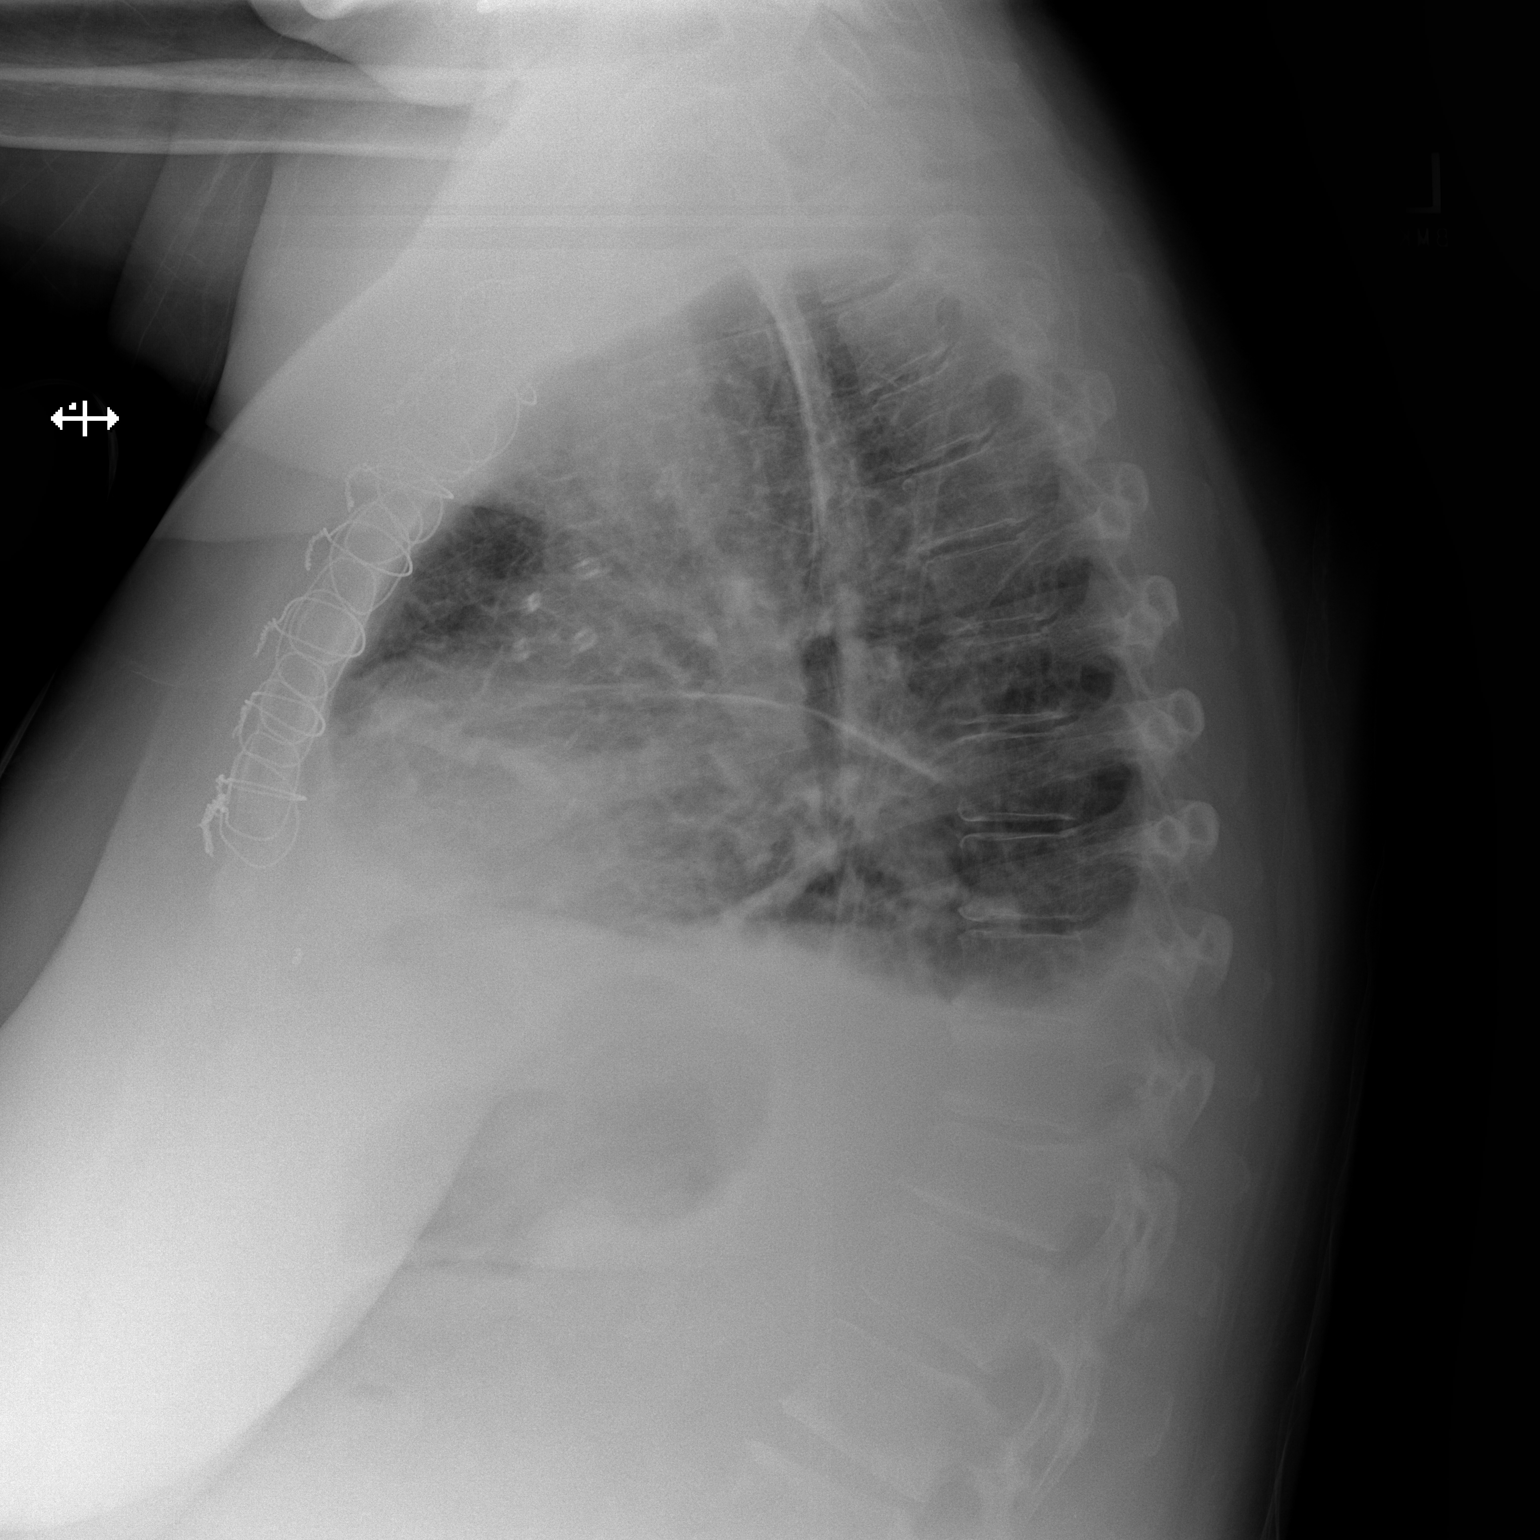
[im 2/2]
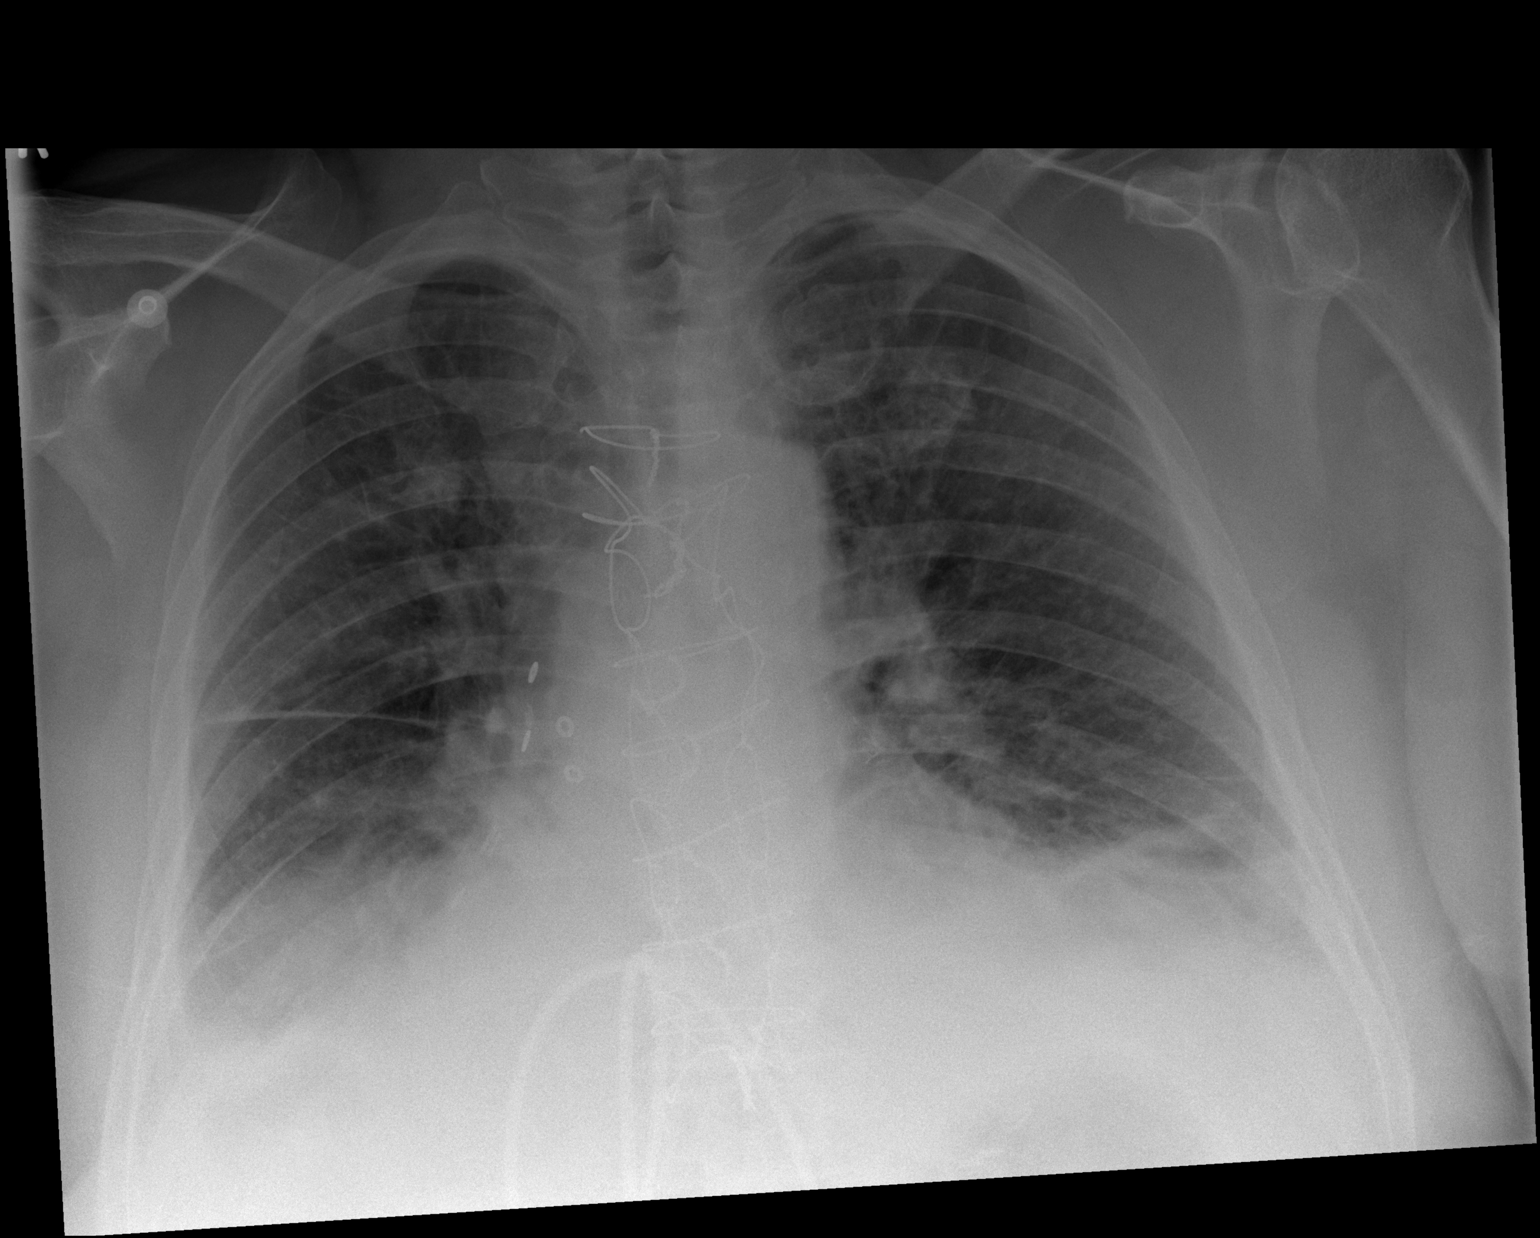

[2 of 2 positions shown; findings below may reference images not displayed]

IMPRESSION: CHF. Slight progression from 01/26/2013.

## 2015-01-23 NOTE — Consult Note (Signed)
General Aspect 49 yo female with history of CAD s/p CABG 2/13 at [facility], severe PAD s/p left BKA 2013, recent right LE PTA April 2014, DM, HTN, HLD, depression, asthma admitted with chest pain, NSTEMI.  She has had chest pain on/off for last two weeks. Presented to ED today because of worsened SOB and more severe chest pain. Currently chest pain free. Only other c/o weakness. No fever, chills, cough.   PMH:  CAD s/p CABG 2/13 at [facility], stress test April 2014 at [facility] with anterior ischemia (opted out of cath) PAD s/p left BKA, recent right LE PTA april 2014 at [facility] DM HTN HLD Depression Asthma Diastolic CHF (LVEF 98% by stress myoview April 2014)  PSH: CABG 2/13 Left BKA  All: Accupril, Bactrim  Meds: see computer list for active meds  SH: Former tobacco abuse, 1/2 ppd x 10 years. Denies smoking at this time. No alcohol or illicit drugs  FH: HTN-mother   Physical Exam:  GEN well developed, well nourished, no acute distress   HEENT moist oral mucosa   NECK supple  No masses   RESP normal resp effort  crackles  basilar crackles   CARD Regular rate and rhythm  No murmur   ABD denies tenderness  normal BS   EXTR left BKA, right ankle foot with wounds   SKIN skin turgor good   NEURO follows commands, Non-focal   PSYCH A+O to time, place, person   Review of Systems:  Subjective/Chief Complaint As stated in HPI and otherwise negative   Home Medications: Medication Instructions Status  Lasix 40 mg oral tablet 1 tab(s) orally once a day Active  ferrous sulfate 325 mg oral tablet 1 tab(s) orally once a day Active  famotidine 20 mg oral tablet 1 tab(s) orally 2 times a day Active  silver sulfADIAZINE topical 1% topical cream Apply topically to affected area once a day Active  Combivent Respimat CFC free 100 mcg-20 mcg/inh inhalation aerosol 2 puff(s) inhaled every 6 hours, As Needed - for Shortness of Breath Active  Metoprolol Tartrate 50 mg oral tablet 1 tab(s) orally 2  times a day Active  clopidogrel 75 mg oral tablet 1 tab(s) orally once a day Active  atorvastatin 40 mg oral tablet 1 tab(s) orally once a day (at bedtime) Active  Cymbalta 60 mg oral delayed release capsule 1 cap(s) orally once a day Active  traZODone 50 mg oral tablet 1 tab(s) orally once (at bedtime) Active  gabapentin 300 mg oral capsule 1 cap(s) orally 3 times a day Active  Nitrostat 0.4 mg sublingual tablet 1 tab(s) sublingual every 5 minutes x 5 days, As Needed x 3 - for Chest Pain Active  isosorbide mononitrate extended release 60 mg oral tablet, extended release 1 tab(s) orally once a day (in the morning) Active  oxyCODONE 5 mg oral tablet 1 tab(s) orally every 6 hours, As Needed - for Pain Active  acetaminophen 325 mg oral tablet 2 tab(s) orally every 4 hours, As needed, pain or temp. greater than 100.4 Active  docusate sodium 100 mg oral capsule 1 cap(s) orally 2 times a day, As Needed, constipation , As needed, constipation Active  amoxicillin-clavulanate 875 mg-125 mg oral tablet 1 tab(s) orally every 12 hours x 14 days Active  insulin glargine 100 units/mL subcutaneous solution 20  subcutaneous once a day (at bedtime) Active  Aspirin Enteric Coated 81 mg oral delayed release tablet 1 tab(s) orally once a day Active   Lab Results:  Hepatic:  02-May-14 22:46   Bilirubin, Total 0.3  Alkaline Phosphatase  178  SGPT (ALT) 19  SGOT (AST) 22  Total Protein, Serum 8.1  Albumin, Serum  1.8  Routine Chem:  02-May-14 22:46   Result Comment TROPONIN - RESULTS VERIFIED BY REPEAT TESTING.  - C/BRANDY DAVIS AT 2330 02/01/13.RW  - READ-BACK PROCESS PERFORMED. PBNP - REPEATED AND CONFIRMED BY DILUTION  Result(s) reported on 01 Feb 2013 at 11:27PM.  B-Type Natriuretic Peptide [facility])  [phone] (Result(s) reported on 02 Feb 2013 at 12:19AM.)  Glucose, Serum  371  BUN  25  Creatinine (comp)  1.33  Sodium, Serum  133  Potassium, Serum 4.4  Chloride, Serum 98  CO2, Serum 30  Calcium (Total),  Serum 8.8  Osmolality (calc) 286  eGFR (African American)  54  eGFR (Non-African American)  47 (eGFR values <84m/min/1.73 m2 may be an indication of chronic kidney disease (CKD). Calculated eGFR is useful in patients with stable renal function. The eGFR calculation will not be reliable in acutely ill patients when serum creatinine is changing rapidly. It is not useful in  patients on dialysis. The eGFR calculation may not be applicable to patients at the low and high extremes of body sizes, pregnant women, and vegetarians.)  Anion Gap  5  03-May-14 09:51   Result Comment APTT - RESULTS VERIFIED BY REPEAT TESTING.  - NOTIFIED OF CRITICAL VALUE  - hp to Gregg Moyer@1055 ,02/02/13  - READ-BACK PROCESS PERFORMED.  Result(s) reported on 02 Feb 2013 at 10:54AM.  Cardiac:  02-May-14 22:46   Troponin I  0.30 (0.00-0.05 0.05 ng/mL or less: NEGATIVE  Repeat testing in 3-6 hrs  if clinically indicated. >0.05 ng/mL: POTENTIAL  MYOCARDIAL INJURY. Repeat  testing in 3-6 hrs if  clinically indicated. NOTE: An increase or decrease  of 30% or more on serial  testing suggests a  clinically important change)  Routine Coag:  02-May-14 22:46   Activated PTT (APTT) 35.0 (A HCT value >55% may artifactually increase the APTT. In one study, the increase was an average of 19%. Reference: "Effect on Routine and Special Coagulation Testing Values of Citrate Anticoagulant Adjustment in Patients with High HCT Values." American Journal of Clinical Pathology 2006;126:400-405.)  03-May-14 09:51   Activated PTT (APTT)  51.7 (A HCT value >55% may artifactually increase the APTT. In one study, the increase was an average of 19%. Reference: "Effect on Routine and Special Coagulation Testing Values of Citrate Anticoagulant Adjustment in Patients with High HCT Values." American Journal of Clinical Pathology 2006;126:400-405.)  Routine Hem:  02-May-14 22:46   WBC (CBC)  15.3  RBC (CBC)  3.32  Hemoglobin  (CBC)  10.0  Hematocrit (CBC)  29.8  Platelet Count (CBC)  653  MCV 90  MCH 30.1  MCHC 33.5  RDW  15.3  Neutrophil % 80.2  Lymphocyte % 11.6  Monocyte % 7.1  Eosinophil % 0.8  Basophil % 0.3  Neutrophil #  12.3  Lymphocyte # 1.8  Monocyte #  1.1  Eosinophil # 0.1  Basophil # 0.0 (Result(s) reported on 01 Feb 2013 at 11:01PM.)   EKG:  EKG Interp. by me  NSR   Interpretation NSR, septal infarct, old. Non-specific T wave abnormalities lateral and anterolateral leads   Radiology Results: XRay:    02-May-14 23:02, Chest PA and Lateral  Chest PA and Lateral   REASON FOR EXAM:    Shortness of Breath  COMMENTS:   May transport without cardiac monitor    PROCEDURE: DXR - DXR  CHEST PA (OR AP) AND LATERAL  - Feb 01 2013 11:02PM     RESULT: Cardiomegaly. Prior CABG. Pulmonary vascular prominence.   Interstitialprominence. Small pleural effusions. Findings have   progressed slightly from 01/26/2013.    IMPRESSION:  CHF. Slight progression from 01/26/2013.        Verified By: Osa Craver, M.D., MD    Bactrim: Other  Accupril: Other  Vital Signs/Nurse's Notes: **Vital Signs.:   03-May-14 13:57  Vital Signs Type Admission  Celsius 36.3  Temperature Source oral  Pulse Pulse 74  Respirations Respirations 18  Systolic BP Systolic BP 537  Diastolic BP (mmHg) Diastolic BP (mmHg) 84  Mean BP 101  Pulse Ox % Pulse Ox % 97  Pulse Ox Activity Level  At rest  Oxygen Delivery Room Air/ 21 %  *Intake and Output.:   03-May-14 13:58  Grand Totals Intake:  0 Output:  0    Net:  0 24 Hr.:  0  Weight Type admission  Weight Method Bed  Current Weight (lbs) (lbs) 221.7  Current Weight (kg) (kg) 100.5  Height (ft) (feet) 5  Height (in) (in) 5  Height (cm) centimeters 165.1  BSA (m2) 2  BMI (kg/m2) 36.8    Impression 1. CAD/NSTEMI: Pt admitted with SOB, chest pain, volume overload. Troponin elevated. Known CAD with prior CABG in 2013 (at [facility], records not available). This  appears to be an acute coronary syndrome. She is currently being treated with her home medications including beta blocker, statin, ASA, Plavix. She is also on a heparin drip. I agree with the present treatment. She had an abnormal stress test one week ago with possible anterior wall ischemia. She elected for conservative management at that time. She will most likely need a cardiac cath now to define her CAD with this presentation.  -Continue current therapy with ASA/Plavix/beta blocker/statin -NTG drip if she has recurrent chest pain -IV heparin drip -NPO at midnight Sunday night for cardiac cath Monday morning if H/H and renal function stable  2. Acute on chronic diastolic CHF: clearly volume overloaded. Normal LV function by stress myoview last week. Agree with IV diuresis. Follow renal function closely while diuresing.   Electronic Signatures for Addendum Section:  Burnell Blanks (MD) (Signed Addendum [phone] 14:38)  Continue to cycle cardiac markers.   Electronic Signatures: Burnell Blanks (MD)  (Signed 03-May-14 14:36)  Authored: General Aspect/Present Illness, History and Physical Exam, Review of System, Home Medications, Labs, EKG , Radiology, Allergies, Vital Signs/Nurse's Notes, Impression/Plan   Last Updated: 03-May-14 14:38 by Burnell Blanks (MD)

## 2015-01-23 NOTE — Consult Note (Signed)
Brief Consult Note: Diagnosis: rt LE pain, infection.   Patient was seen by consultant.   Consult note dictated.   Recommend further assessment or treatment.   Orders entered.   Comments: rle infection,. possible osteo. No sign of NF. left BKA with open wound with purulence. dressing changes.  Electronic Signatures: Lattie Hawooper, Azaryah Heathcock E (MD)  (Signed 16-Apr-14 20:22)  Authored: Brief Consult Note   Last Updated: 16-Apr-14 20:22 by Lattie Hawooper, Claryssa Sandner E (MD)
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
## 2015-01-23 NOTE — Op Note (Signed)
PATIENT NAME:  [name], [name] MR#:  [id number] DATE OF BIRTH:  15-Feb-1966  DATE OF PROCEDURE:  01/18/2013  PREOPERATIVE DIAGNOSIS:  Necrosis with abscess, right lower extremity.   POSTOPERATIVE DIAGNOSIS:  Necrosis with abscess, right lower extremity.   PROCEDURE PERFORMED:  Incision and drainage with excisional debridement, right leg.   SURGEON:  Renford DillsGregory G. Annarae Macnair, MD.  ANESTHESIA:  General by endotracheal intubation.   FLUIDS:  Per anesthesia record.   ESTIMATED BLOOD LOSS:  Minimal.   SPECIMENS:  A segment of muscle tissue to Pathology for permanent section. Also culture of purulent fluid to Microbiology.   INDICATIONS:  The patient is a 49 year old woman who presented to the hospital with sepsis and cellulitis of the right lower extremity. Over the ensuing two days, although her creatinine has improved and her mental status is improved, her leg appears to be worsening and becoming more fluctuant in an area of the below-knee lateral calf. There is now clearly necrosis of the skin with what appears to be purulent fluid. She is therefore undergoing incision and drainage in the Operating Room with great concern for fasciitis and/or myositis. The risks and benefits were reviewed with the patient, she has agreed to proceed.   DESCRIPTION OF PROCEDURE:  The patient is taken to the Operating Room and placed in the supine position. After adequate general anesthesia is induced and appropriate invasive monitors are placed, she is prepped and draped from her mid thigh down to her foot.   Using a 15-blade scalpel, an elliptical incision is created around the fluctuant area and the incision is then carried down full thickness skin into the soft tissues. The fascia is then exposed and the entire elliptical area of tissue is then excised off the fascia. Several areas of the skin are not bleeding at all and further segments of skin undertaken again taking full thickness skin with the 15 blade scalpel and  the soft tissues down to the fascia. Although the fascia is a glistening white and appears to be intact, I still have concerns that there is a necrotizing infection below this level, and therefore an ellipse of fascial tissue is removed with the Mets scissors and passed off the field. The muscle of the gastrocs as well as the anterior compartment is then exposed. It appears to be quite pale upon debridement with Mayo scissors. There is some bleeding and this is controlled with Bovie. The muscle does react sluggishly to the Bovie. The wound is then inspected. Final hemostasis is obtained. It is irrigated and a VAC wound dressing is applied.   Again, to be specific, the area of the wound measures 10 x 8 cm. It is somewhat circular in its shape. It is dissected full thickness skin down to the fascia. The fascia was also debrided sharply, both 15 blade scalpel and curved Mayo scissors are utilized. A segment of the muscle tissue is sent for histologic analysis and a micro specimen was obtained of the fluid.   ____________________________ Renford DillsGregory G. Hulbert Branscome, MD ggs:jm D: 01/18/2013 16:53:49 ET T: 01/19/2013 10:20:59 ET JOB#: [id number]  cc: Renford DillsGregory G. Kiya Eno, MD, <Dictator> Renford DillsGREGORY G Rishabh Rinkenberger MD ELECTRONICALLY SIGNED 02/11/2013 13:48
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
## 2015-01-23 NOTE — Consult Note (Signed)
PATIENT NAME:  [name], [name] MR#:  [id number] DATE OF BIRTH:  1966/10/01  DATE OF CONSULTATION:  02/05/2013  REFERRING PHYSICIAN:   CONSULTING PHYSICIAN:  Adah Salvageichard E. Excell Seltzerooper, MD  CHIEF COMPLAINT: Right leg wound.   HISTORY OF PRESENT ILLNESS: I was asked by the wound care nurse specialist to see this patient for a chronic right leg wound. I had seen the patient in the past concerning possible osteomyelitis and the patient was being seen by podiatry at the same time, as well as vascular surgery. She has been admitted to the hospital again for wound care and also has had a recent non-ST myocardial infarction.   PAST MEDICAL HISTORY: Morbid obesity, non-ST elevated MI, coronary artery disease, diabetes, neuropathy, depression, asthma, peripheral vascular disease, recent MI.   SOCIAL HISTORY: The patient stopped smoking a year ago.   REVIEW OF SYSTEMS:  A 10-system review is performed and negative with the exception of that mentioned in the history of present illness.   MEDICATIONS: Multiple.   ALLERGIES: ACCUPRIL AND SULFA DRUGS.   PHYSICAL EXAMINATION: GENERAL: Morbidly obese female patient, BMI 36.5, 220 pounds, 65 inches tall.  VITAL SIGNS: Temperature of 97.9, pulse 72, respirations 18, blood pressure 167/105, 94% room air sat.  HEENT: No scleral icterus.  NECK: No palpable neck nodes.  ABDOMEN: Soft, nontender.  EXTREMITIES:  Left lower extremity is a below the knee amputation with draining wound laterally. On the right leg, mid-calf somewhat laterally, is a full-thickness wound measuring approximately 8 x 9 cm with fibrinopurulent exudate on the fascia. The muscle was viable. Multiple areas of eschar formation throughout the lower extremity on the right side as well. LABORATORY DATA:  White blood count 11.7, hemoglobin and hematocrit of 10 and 31, platelet count of 499. Electrolytes are within normal limits.   ASSESSMENT AND PLAN: This is a patient with a chronic wound of the right  leg. The patient is seen and examined with a wound care specialist who had a asked for this surgical consult for debridement of this wound. At this point, the patient cannot tolerate a debridement at the bedside due to pain and could not tolerate a general anesthetic, due to the recent myocardial infarction. I have recommended normal saline wet-to-dry of a frequent nature to debride this wound of its fibrinopurulent exudate. The wound care specialist was in agreement with this plan. Will reassess in a few days for effectiveness of this regimen.   ____________________________ Adah Salvageichard E. Excell Seltzerooper, MD rec:cc D: 02/05/2013 16:55:53 ET T: 02/05/2013 18:31:18 ET JOB#: [id number]  cc: Adah Salvageichard E. Excell Seltzerooper, MD, <Dictator> Lattie HawICHARD E Laurell Coalson MD ELECTRONICALLY SIGNED 02/08/2013 7:40

## 2015-01-23 NOTE — Op Note (Signed)
PATIENT NAME:  Lenise ArenaCATHCART, Taylia MR#:  [id number] DATE OF BIRTH:  1966/02/11  DATE OF PROCEDURE:  04/04/2013  PREOPERATIVE DIAGNOSIS: Right lower extremity ulcer.   POSTOPERATIVE DIAGNOSIS: Right lower extremity ulcer.    OPERATION: Excisional debridement and wound VAC placement.   ANESTHESIA: General.  SURGEON: Quentin Orealph L. Ely, MD.   OPERATIVE PROCEDURE: With the patient in the supine position after the induction of appropriate moderate sedation, the patient's right leg was prepped with Betadine and draped with sterile towels. Using a knife and scissors, subcutaneous tissue, skin and areas of necrotic muscle were debrided sharply. The area was then scrubbed. A wound VAC was placed without difficulty. Seal was achieved. An Ioban drape was placed for the lower level. The patient was then returned to the recovery room having tolerated the procedure well. Sponge, instrument and needle counts were correct x2 in the operating room.    ____________________________ Quentin Orealph L. Ely III, MD rle:dp D: 04/16/2013 03:57:00 ET T: 04/16/2013 07:14:53 ET JOB#: [id number]  cc: Quentin Orealph L. Ely III, MD, <Dictator> Quentin OreALPH L ELY MD ELECTRONICALLY SIGNED 04/16/2013 19:37

## 2015-01-23 NOTE — Op Note (Signed)
PATIENT NAME:  [name], [name] MR#:  [id number] DATE OF BIRTH:  05/27/1966  DATE OF PROCEDURE:  05/06/2013  PREOPERATIVE DIAGNOSES:  1.  Peripheral arterial disease with gangrene, right lower extremity.  2.  Diabetes mellitus.  3.  Status post left below-knee amputation.  4.  Coronary disease.  POSTOPERATIVE DIAGNOSES: 1.  Peripheral arterial disease with gangrene, right lower extremity.  2.  Diabetes mellitus.  3.  Status post left below-knee amputation.  4.  Coronary disease.  PROCEDURES PERFORMED:  1.  Ultrasound guidance for vascular access, left femoral artery.  2.  Catheter placement to right posterior tibial artery from left femoral approach.  3.  Aortogram with selective right lower extremity angiogram.  4.  Percutaneous transluminal angioplasty of right tibioperoneal trunk and posterior tibial artery proximally with 3 and 4 mm diameter angioplasty balloon.  5.  StarClose closure device, left femoral artery.   SURGEON: Annice Needy, M.D.   ANESTHESIA: Local with moderate conscious sedation.   ESTIMATED BLOOD LOSS: Minimal.   INDICATION FOR PROCEDURE: A 49 year old white female with multiple medical issues and severe peripheral disease. She has already had an amputation of her left leg. She has a gangrenous right second toe. She also has a non-healing ulcer in her right calf. She had previous right lower extremity interventions several months ago. A recent noninvasive study showed recurrent disease in the tibioperoneal trunk and proximal posterior tibial artery and likely inadequate perfusion for wound healing. Angiogram is performed for further evaluation and potential revascularization in hopes of limb salvage. The risks and benefits were discussed. Informed consent was obtained.   DESCRIPTION OF PROCEDURE: The patient was brought to the vascular interventional radiology suite. Groins were shaved, prepped and a sterile surgical field was created. Ultrasound was used to visualize  a patent left femoral artery. It was accessed under direct ultrasound guidance without difficulty with a Seldinger needle. A J-wire and 5-French sheath were placed. Pigtail catheter was placed in the aorta at the L2 level and an AP aortogram was performed. This showed patent renal arteries. No flow-limiting stenosis in the aortoiliac segments. I then hooked the aortic bifurcation and advanced to the right femoral head and selective right lower extremity angiogram was then performed. This showed a somewhat pruned profunda femoris artery without focal stenosis. The common femoral artery was patent. The superficial femoral artery was patent. The popliteal artery had am irregular stenosis in the mid segment at the level of the knee, but this did not appear flow limiting and did not appear to be more than 30% to 35%. The posterior tibial artery was the runoff to the foot with the anterior tibial and peroneal arteries chronically occluded. The tibioperoneal trunk and proximal posterior tibial artery had an 80% to 85% stenosis. The patient was systemically heparinized. A 6-French Ansell sheath was placed over a Terumo advantage wire. I was able to cross the lesion with a V18 wire and Kumpe catheter without difficulty. A 3 mm diameter angioplasty balloon was then used to treat this lesion.; however, there remained a greater than 50% residual stenosis and so I upsized to a 4 mm diameter angioplasty balloon with improved flow and less than 25% residual stenosis.   At this point, I elected to terminate the procedure. The StarClose closure device was deployed in the left femoral artery with excellent hemostatic result. The patient tolerated the procedure well and was taken to the recovery room in stable condition.  ____________________________ Annice Needy, MD jsd:aw D: 05/06/2013 10:26:46 ET  T: 05/06/2013 10:33:04 ET JOB#: [id number]  cc: Annice NeedyJason S. Dew, MD, <Dictator> Janeann ForehandJames H. Hawkins Jr., MD Annice NeedyJASON S DEW  MD ELECTRONICALLY SIGNED 05/09/2013 8:18

## 2015-01-23 NOTE — Discharge Summary (Signed)
PATIENT NAME:  [name], [name] MR#:  [id number] DATE OF BIRTH:  1966-08-05  DATE OF ADMISSION:  01/26/2013 DATE OF DISCHARGE:    PRIMARY CARE PHYSICIAN: None.   DISCHARGE DIAGNOSES: 1.  Unstable angina.  2.  Acute hypoxic respiratory failure.  3.  Acute on chronic diastolic congestive heart failure.  4.  Bilateral pleural effusions.  5.  Right lower extremity wound, status post incision and drainage and wound VAC placement, on Augmentin.  6.  Peripheral vascular disease.  7.  Hypertension.  8.  Insulin-dependent diabetes mellitus.  9.  Depression.  10.  Chronic pain syndrome.  11.  Asthma.   CONSULTANTS: [facility].   IMAGING STUDIES DONE: Include:  1.  A CT scan of the chest for pulmonary embolism showed bilateral pleural effusions and pulmonary edema.  2.  Chest x-ray showed pulmonary edema.  3.  Nuclear stress test showed left ventricular global function with ejection fraction of 55%. Findings consistent with 1-vessel disease although a suboptimal study overall. Moderate-sized, partially reversible defect of moderate severity in mid, distal, anterior and anterolateral wall likely due to anterior and anterolateral ischemia as read by Dr. Kirke Corin.   ADMITTING HISTORY AND PHYSICAL AND HOSPITAL COURSE: Please see detailed H and P dictated by Dr. Randol Kern on 01/26/2013. In brief, a 49 year old Caucasian female patient who was recently discharged after a prolonged stay for right lower extremity wound, returned to the Emergency Room complaining of chest pain. The patient was also found to be short of breath in congestive heart failure, was started on Lasix, scheduled for a stress test, cardiology consulted.   HOSPITAL COURSE: 1.  Unstable angina. The patient was scheduled for a stress test. Mild elevation in troponin. It showed 1-vessel disease but secondary to her comorbidities, prior history of contrast nephropathy and recent infection of the right lower extremity, on discussing with  the patient Dr. Kirke Corin suggested that a cardiac cath be deferred at this time per patient's preference. The patient is supposed to follow up at the cardiology clinic for further workup in 2 to 3 weeks.  2.  Acute on chronic diastolic CHF. The patient was diuresed well with IV Lasix, initially was on 2 liters oxygen but on the day of discharge is saturating 96% on room air.  3.  Chronic pain syndrome. The patient was given prescriptions for pain medications.  4.  Her hypertension and diabetes were well-controlled during the hospital stay.   On the day of discharge, the patient does not complain of any pain, no shortness of breath. Lung examination shows no crackles.  The patient is being discharged back home with home health in a stable condition.    DISCHARGE MEDICATIONS:  Include: 1.  Aspirin 81 mg oral once a day.  2.  Insulin glargine 20 units subcutaneous once a day at bedtime.  3.  Augmentin 875/25, 1 tablet oral every 12 hours for 10 more days.  4.  Acetaminophen 325 mg 2 tablets orally every 4 hours as needed.  5.  Oxycodone 5 mg oral every 6 hours as needed for pain.  6.  Isosorbide mononitrate 60 mg oral once a day.  7.  Nitrostat 0.4 mg sublingual every 5 minutes as needed for chest pain.  8.  Gabapentin 300 mg oral 3 times a day.  9.  Trazodone 50 mg oral once a day.  10.  Cymbalta 60 mg oral once a day.  11.  Atorvastatin 40 mg oral once a day.  12.  Plavix 75 mg  oral once a day.  13.  Metoprolol tartrate 50 mg oral 2 times a day.  14.  Combivent Respimat 2 puffs inhaled every 6 hours as needed for shortness of breath.  15.  Silver sulfadiazine topical 1% cream apply topically to affected area once a day.  16.  Famotidine 20 mg oral 2 times a day.  17.  Ferrous sulfate 325 mg oral once a day.  18.  Docusate sodium 100 mg oral 2 times a day as needed for constipation.  19.  Lasix 40 mg oral once a day.   DISCHARGE INSTRUCTIONS: The patient is being discharged home on a low-salt,  diabetic diet. Activity as tolerated. [facility] 1 to 2 weeks and primary care physician in 1 to 2 weeks. Follow up with Dr. Kirke CorinArida of cardiology in 1 to 2 weeks.   TIME SPENT: On day of discharge in discharge activity was 38 minutes.   ____________________________ Molinda BailiffSrikar R. Sherly Brodbeck, MD srs:cs D: 01/29/2013 13:13:22 ET T: 01/29/2013 15:39:52 ET JOB#: [id number]  cc: Wardell HeathSrikar R. Aalayah Riles, MD, <Dictator> Orie FishermanSRIKAR R Chasiti Waddington MD ELECTRONICALLY SIGNED 02/13/2013 13:52

## 2015-01-23 NOTE — Op Note (Signed)
PATIENT NAME:  [name], [name] MR#:  [id number] DATE OF BIRTH:  26-Mar-1966  DATE OF PROCEDURE:  01/23/2013  PREOPERATIVE DIAGNOSES:  1.  Peripheral arterial disease with ulceration and infection, right lower extremity.  2.  Diabetes mellitus.  3.  Hypertension.  4.  Status post left lower extremity amputation.  POSTOPERATIVE DIAGNOSES:  1.  Peripheral arterial disease with ulceration and infection, right lower extremity.  2.  Diabetes mellitus.  3.  Hypertension.  4.  Status post left lower extremity amputation.  PROCEDURES:  1.  Ultrasound guidance for vascular access, left femoral artery.  2.  Catheter placement into right posterior tibial artery from left femoral approach.  3.  Aortogram and selective right lower extremity angiogram.  4.  Percutaneous transluminal angioplasty of right SFA stenosis/occlusion with 5 mm diameter angioplasty balloon.  5.  Percutaneous transluminal angioplasty of right popliteal stenosis with 4 and 5 mm diameter angioplasty balloon.  6.  Percutaneous transluminal angioplasty of tibioperoneal trunk with 4 mm diameter angioplasty balloon.  7.  Percutaneous transluminal angioplasty of multiple stenoses in the right posterior tibial artery with 3 mm diameter angioplasty balloon.  8.  StarClose closure device, left femoral artery.   SURGEON: Annice NeedyJason S. Dew, M.D.   ANESTHESIA: Local with moderate conscious sedation.   ESTIMATED BLOOD LOSS: Approximately 25 mL.  FLUOROSCOPY TIME: 4.3 minutes.  CONTRAST USED: 60 mL of Visipaque.   INDICATION FOR PROCEDURE: This is a 49 year old individual with multiple medical comorbidities. She is status post amputation of the left lower extremity already. She has a severe ulceration with infection of the right lower extremity that has required debridement, and clinical exam is consistent with peripheral vascular disease of the right lower extremity. She is brought down for angiography for further evaluation and possible  revascularization. Risks and benefits are discussed. Informed consent is obtained.   DESCRIPTION OF PROCEDURE: The patient is brought to the vascular and interventional radiology suite. Groins were shaved and prepped and a sterile surgical field was created. The left femoral artery was visualized with ultrasound and found to be patent. It was then accessed under direct ultrasound guidance without difficulty with a Seldinger needle. A J-wire and 5 French sheath were placed. Pigtail catheter was placed in the aorta at the L1-L2 level and AP aortogram was performed. This showed patent renal arteries. The aorta and iliac segments were patent without significant stenosis. I then hooked the aortic bifurcation, advanced to the right femoral head and selective right lower extremity angiograms were then performed. The common femoral artery was normal. The profunda femoris artery was small but not stenotic. The superficial femoral artery was patent proximally but in the mid to distal segment, there was a short segment occlusion that reconstituted within a couple of centimeters. There was then moderate stenosis in the popliteal artery above the knee that was separate and distinct from the SFA lesion. This went down to the tibioperoneal trunk that was highly stenotic, and a posterior tibial artery was the best runoff to the foot that had multiple areas of stenosis within it. The patient was systemically heparinized. A 6 French Ansell sheath was placed over a [employer] wire. I was able to cross all these lesions with the [employer] wire and a 100 cm Kumpe catheter, confirming intraluminal flow in the posterior tibial artery and then replacing the wire. The tibioperoneal trunk lesion and the above-knee popliteal lesion were treated with a 4 mm diameter angioplasty balloon. The tibioperoneal trunk lesion looked better, but the popliteal  artery was then treated with a 5 mm diameter angioplasty balloon above the knee,  and the superficial femoral artery lesion was treated separately with a 5 mm diameter angioplasty balloon in the mid to distal thigh. Following angioplasty, the SFA lesion had a non-flow-limiting dissection with markedly improved flow. The popliteal lesion was widely patent. The tibioperoneal lesion had mild residual stenosis. I then used a 3 mm diameter angioplasty balloon, by 10 cm length, inflated over the majority of the posterior tibial artery from the mid to distal lower leg up to the tibioperoneal trunk. Completion angiogram following this showed significantly improved flow, although there was some significant vasospasm present. The area of stenosis seemed much better. At this point, her flow seems to have been markedly improved and I elected to terminate the procedure. The sheath was pulled back through ipsilateral external iliac artery and oblique arteriogram was performed. StarClose closure device was deployed in the usual fashion with excellent hemostatic result. The patient tolerated the procedure well and was taken to the recovery room in stable condition.    ____________________________ Annice Needy, MD jsd:jm D: 01/23/2013 17:01:25 ET T: 01/23/2013 17:33:25 ET JOB#: [id number]  cc: Annice Needy, MD, <Dictator> Annice Needy MD ELECTRONICALLY SIGNED 01/28/2013 9:29

## 2015-01-23 NOTE — Discharge Summary (Signed)
PATIENT NAME:  [name], FITZGIBBONS MR#:  [id number] DATE OF BIRTH:  06-Mar-1966  DATE OF ADMISSION:  02/02/2013 DATE OF DISCHARGE:  02/06/2013  PRIMARY CARE PHYSICIAN:  [facility].   FINAL DIAGNOSES: 1.  Acute myocardial infarction versus unstable angina.  2.  Chronic diastolic congestive heart failure.  3.  Right lower extremity chronic wounds.  4.  Peripheral vascular disease.  5.  Diabetes.  6.  Hypertension.  7.  Anemia.  8.  Depression.  9.  Asthma.  10.  Hyperlipidemia.   MEDICATIONS ON DISCHARGE: Include aspirin 81 mg daily, acetaminophen 325 mg 2 tablets every 4 hours as needed for pain or temperature, oxycodone 5 mg every 6 hours as needed for pain, Nitrostat 0.4 mg as needed for chest pain, gabapentin 300 mg 3 times a day, trazodone 50 mg at bedtime, Cymbalta 60 mg daily, atorvastatin 40 mg at bedtime, Plavix 75 mg daily, silver sulfadiazine topical 1% apply to affected area daily and that is a cream, famotidine 20 mg twice a day, ferrous sulfate 325 mg daily, Colace 100 mg 1 capsule twice a day as needed for constipation, Lasix 40 mg daily, Imdur 60 mg extended-release daily, insulin regular 5 units subcutaneous injection prior to meals, Lantus insulin 13 units subcutaneous injection at night, albuterol CFC 1 inhalation every 6 hours for shortness of breath, Augmentin 875/125 one tablet every 12 hours for 7 days.   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 Wet-to-dry dressings right lower extremity and stump.    DIET: Low sodium, carbohydrate -controlled diet.   FOLLOW-UP:  Referral to wound care center in 1 week,  Dr. Kirke Corin, cardiology, next week, 1 to 2 weeks [facility], Dr. Alberteen Spindle, podiatry, has an appointment May 8 at 10:15, Dr. Gilda Crease, vascular surgery, 2:15 p.m. on May 8.    HOSPITAL COURSE: The patient was admitted 02/02/2013 and discharged 02/06/2013. She came in with shortness of breath and chest pain.   HISTORY OF PRESENT ILLNESS: A 49 year old female with numerous recent  hospitalizations for MI. The patient was admitted with acute coronary syndrome, found to have an elevated troponin. The patient also had heart failure and was started initially on IV Lasix.   LABORATORY AND RADIOLOGICAL DATA DURING THE HOSPITAL COURSE: Included EKG showed normal sinus rhythm, septal Q waves,  troponin 0.3, white blood cell count 15.3, hemoglobin and hematocrit 10.0 and 29.8, platelet count 653. Glucose 371, BUN 25, creatinine 1.3, sodium 133, potassium 4.4, chloride 98, CO2 30, calcium 8.8. Liver function tests, ALT and AST normal range. BNP 26,116.   Chest x-ray showed congestive heart failure.   LDL 63, HDL 40, triglycerides 119. The patient was transfused 1 unit of blood with a hemoglobin of 8.7, secondary to cardiac symptoms. Last hemoglobin 10.3. Last creatinine 1.14,   HOSPITAL COURSE PER PROBLEM LIST:    1.  For the patient's acute myocardial infarction and unstable angina. The patient was seen in consultation by [facility]. The patient refused cardiac catheterization. Again the patient refused cardiac catheterization. She was on blood thinners. She is on good medical management with aspirin, Plavix and metoprolol. Her dose was increased to 50 mg t.i.d., Imdur and nitroglycerin. Close clinical follow-up as outpatient with cardiology.  2.  Acute on chronic diastolic congestive heart failure. The patient initially was started on IV Lasix, then Lasix was held and will be restarted as outpatient. The patient does have an allergy to angiotensin-converting enzyme inhibitor, so that was not prescribed. Her beta blocker was titrated up to 50  mg t.i.d.  3.  For right lower extremity chronic wounds, seen in consultation by surgery and wound care, who recommended wet to dry dressing. Close clinical follow-up on these are needed. I did refer the patient to the wound care center. The patient will also follow-up with Dr. Toula Moosup Cline at podiatry, has an appointment tomorrow as per care  manager Independenceline.  4.  Peripheral vascular disease. The patient is on aspirin and Plavix.  5.  Diabetes. I did write the prescriptions for Lantus and short-acting insulin.  6.  Hypertension. Blood pressure is 133/79 upon discharge.  7.  Anemia. The patient was transfused a unit of packed red blood cells while here,  thinking that this may be worsening her cardiac symptoms. The patient is on iron.  8.  Depression. The patient is on Cymbalta.  9.  Asthma. Respiratory status stable.  10.  Hyperlipidemia. The patient's  LDL 63, HDL 40, cholesterol profile is excellent on Lipitor.   TIME SPENT ON DISCHARGE: 40 minutes.   ____________________________ [name] J. [name] GlossWieting, MD rjw:cc D: 02/06/2013 15:33:16 ET T: 02/06/2013 16:15:18 ET JOB#: [id number]  cc: [name] J. [name] GlossWieting, MD, <Dictator> Linus Galasodd Cline, [place] DakotaDPM Salley ScarletICHARD J Kristianna Saperstein MD ELECTRONICALLY SIGNED 02/08/2013 20:03

## 2015-01-23 NOTE — Op Note (Signed)
PATIENT NAME:  [name], [name] MR#:  [id number] DATE OF BIRTH:  [birth year]  DATE OF PROCEDURE:  01/22/2013  PREOPERATIVE DIAGNOSIS:  Cellulitis, right lower extremity.  POSTOPERATIVE DIAGNOSIS:   Cellulitis, right lower extremity.  PROCEDURE PERFORMED:   1.  Ultrasound guidance, right basilic vein. 2.  Fluoroscopic guidance for placement of catheter. 3.  Insertion of a triple lumen PICC line, right arm.  SURGEON:  Levora DredgeGregory Schnier, MD  ANESTHESIA: Local.   ESTIMATED BLOOD LOSS: Minimal.   INDICATION FOR PROCEDURE:  Requiring antibiotics greater than 5 days.  DESCRIPTION OF PROCEDURE: The patient's right arm was sterilely prepped and draped, and a sterile surgical field was created. The basilic vein was accessed under direct ultrasound guidance without difficulty with a micropuncture needle and permanent image was recorded. 0.018 wire was then placed into the superior vena cava. Peel-away sheath was placed over the wire. A single lumen peripherally inserted central venous catheter was then placed over the wire and the wire and peel-away sheath were removed. The catheter tip was placed into the superior vena cava and was secured at the skin at 35 cm with a sterile dressing. The catheter withdrew blood well and flushed easily with heparinized saline. The patient tolerated procedure well.    ____________________________ Renford DillsGregory G. Schnier, MD ggs:ct D: 01/23/2013 14:54:54 ET T: 01/23/2013 15:29:50 ET JOB#: [id number]  cc: Renford DillsGregory G. Schnier, MD, <Dictator> Renford DillsGREGORY G SCHNIER MD ELECTRONICALLY SIGNED 02/11/2013 13:48

## 2015-01-23 NOTE — Op Note (Signed)
PATIENT NAMLenise Arena:  Pai, Tema MR#:  [id number] DATE OF BIRTH:  03/16/1966  DATE OF PROCEDURE:  05/15/2013  PREOPERATIVE DIAGNOSES: 1.  Gangrene, right second toe.  2.  Diabetes mellitus.  3.  Peripheral arterial disease, status post intervention.   POSTOPERATIVE DIAGNOSES: 1.  Gangrene, right second toe.  2.  Diabetes mellitus.  3.  Peripheral arterial disease, status post intervention.   PROCEDURE:  Right second toe amputation.   SURGEON: Annice NeedyJason S. Dew, M.D.   ANESTHESIA: MAC.   ESTIMATED BLOOD LOSS: Minimal.   INDICATION FOR PROCEDURE: A 49 year old white female with multiple ongoing issues poorly controlled diabetes, severe peripheral vascular disease, status post 2 interventions. She has a gangrenous right second toe that needs to be removed.   DESCRIPTION OF PROCEDURE: The patient is brought to the operative suite, and after adequate level of intravenous sedation the right lower extremity was sterilely prepped and draped and a sterile surgical field was created. A fishmouth incision was created around the base of the toe in healthy viable skin. The toe was then dissected out and removed and rongeurs were used to go down to the metatarsal head to remove all unhealthy tissue.  The wound was then closed with 3-0 Vicryl and 2-0 nylon and sterile dressing was placed.    ____________________________ Annice NeedyJason S. Dew, MD jsd:dp D: 05/15/2013 12:25:10 ET T: 05/15/2013 13:22:41 ET JOB#: [id number]  cc: Annice NeedyJason S. Dew, MD, <Dictator> Annice NeedyJASON S DEW MD ELECTRONICALLY SIGNED 05/20/2013 16:07

## 2015-01-23 NOTE — Consult Note (Signed)
General Aspect 49yo w/ PMHx s/f CAD, PVD, DM2 w/ diabetic neuropathy, HLD, asthma and depression admitted to [facility] 01/16/13 for sepsis 2/2 RLE cellulitis, abscess and necrosis.   Prior medical care received at [facility]- CABG in 2013, L BKA in 2013. Recently moved to [place], and has not yet established care. As a result, she has been out of her usual outpatient meds for several weeks. She reported progressive RLE pain, tenderness and ulceration.   PAST MEDICAL HISTORY:  1. Coronary artery disease.  2. Diabetes mellitus.  3. Diabetic neuropathy.  4. Depression.  5. Hyperlipidemia.  6. Asthma.   PAST SURGICAL HISTORY:  1. 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  2. CABG in February 2013 at [facility].   FAMILY HISTORY: Positive for diabetes mellitus.   SOCIAL HISTORY: Currently lives with her boyfriend. Quit smoking for 1.5 yrs. No history of alcohol or illicit drug use.   Present Illness On ED eval, she was tachycardic, hypotensive, + leukocytosis 32K c/w sepsis. EKG- 149 bpm, septal Qs, diffuse flat/downsloping ST dep. BUN 34/Cr 3.32, Na 123. U/a WNL. Glu 416. RLE doppler- neg DVT, + groin adenopathy. 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 R Tib/fib & foot radiograph- changes c/w cellulitis, OA, no osteomyelitis. She was admitted by the medicine service for sepsis, acute renal failure, RLE cellulitis/abscess with necrosis. Seen by vascular, plan angiogram for limb salvage after renal function improved. Bld cx neg x 2. Started on BS abx, IVF. Underwent I&D and debridement of RLE abcess. Cr and WBC downtrending, BP improved.   Endorsed episode of L sided cp yesterday, 5/10. EKG unchanged from the ED. Trop 0.36->0.44. CK-MB nl x 2. Cards consulted for cp and pre-op eval. Echo yesterday-  EF 50-55%, nl LV/RV size, thickness and function. Nl RVSP.  She reports intermittent chest tightness c/w prior angina at home occurring mostly at rest rated at a 5/10 as she is sedentary due to L BKA (prosthetic  not yet received) with associated nausea and SOB. These episodes have not increased in frequency or severity, and are promptly relieved with NTG SL x 1. Denies resting SOB, PND, orthopnea.   Physical Exam:  GEN obese, visibly in pain   HEENT PERRL, hearing intact to voice   NECK supple  No masses  trachea midline   RESP normal resp effort  clear BS  no use of accessory muscles  no wheezes, rales or rhonchi   CARD Tachycardic  Normal, S1, S2  No murmur   ABD denies tenderness  soft  normal BS   EXTR negative cyanosis/clubbing, L BKA with wound dehiscence, RLE erythematous, edematous, decreased (trace) DP/PT pulse   SKIN positive rashes, positive ulcers   NEURO follows commands, motor/sensory function intact   PSYCH alert, A+O to time, place, person   Review of Systems:  Subjective/Chief Complaint R leg pain   General: Fever/chills   Skin: Rashes  Color changes   Respiratory: No Complaints   Cardiovascular: Chest pain or discomfort  Tightness  Dyspnea   Gastrointestinal: No Complaints   Vascular: Leg cramping     CAD:    Depression:    Asthma:    Hyperlipidemia:    PAD:    insulin dependent diabetes:    below knee amputation:    cabg x7:        Admit Diagnosis:   SEPSIS: Onset Date: 21-Jan-2013, Status: Active, Description: SEPSIS      Admit Reason:   Sepsis (038.9): Status: Active, Coding  System: ICD9, Coded Name: Unspecified septicemia    MorphINE  injection, 2 to 4 mg, IV push, q2h PRN for pain  Indication: Pain, [Med Admin Window: 30 mins before or after scheduled dose], 16-Jan-2013, Discontinued, Standard   Vancomycin  injection,  ( Vancocin injection )  1000 mg in Dextrose 5% 250 ml, IV Piggyback, once, Infuse over 60 minute(s)  Indication: Infection, 16-Jan-2013, Completed, Standard   Piperacillin-Tazobactam injection,  ( Zosyn )  3.375 gram, IV Piggyback, once, Infuse over 30 minute(s)  Indication: Infection, 16-Jan-2013, Discontinued,  Standard   Acetaminophen-oxyCODONE 325/5 mg tablet, ( Tylox 5-325)  1 to 2 tablet(s) Oral q4h PRN for pain  - Indication: Pain  Instructions:  [Med Admin Window: 30 mins before or after scheduled dose], 16-Jan-2013, Discontinued, Standard   HePARin injection, 5000 unit(s), Subcutaneous, q12h  Indication: Anticoagulant, Monitor Anticoags per hospital protocol, 16-Jan-2013, Discontinued, Standard   Insulin SS -Novolog injection, Subcutaneous, FSBS tid/ac  0 units if FSBS 0-150     2 unit(s) if FSBS 151 - 200     4 unit(s) if FSBS 201 - 250     6 unit(s) if FSBS 251 - 300     8 unit(s) if FSBS 301 - 350     10 unit(s) if FSBS 351 - 400  Call MD if FSBS is greater than 400, [Waste Code: Black], 16-Jan-2013, Discontinued, Standard   Ondansetron disintegrating tablet, ( Zofran ODT)  4 mg Oral q6h PRN for nausea, vomiting  - Indication: Nausea/ Vomiting, 16-Jan-2013, Discontinued, Standard   Pantoprazole tablet, 40 mg Oral q6am  - Indication: Erosive Esophagitis/ GERD  Instructions:  DO NOT CRUSH, 16-Jan-2013, Discontinued, Standard   Piperacillin-Tazobactam injection, ( Zosyn )  3.375 gram, IV Piggyback, q8h, Infuse over 4 hour(s)  Indication: Infection  Pharmacy dose per Zosyn Ext Inf Protocol, -Then pharmacy to dose, 16-Jan-2013, Discontinued, Standard   Insulin Aspart Injection,  ( NovoLOG )  10 unit(s), Subcutaneous, once  Indication: Diabetes, [Waste Code: Black], 16-Jan-2013, Completed, Standard   Aspirin Enteric Coated tablet, ( Ecotrin)  81 mg Oral daily  - Indication: Pain/Fever/Thromboembolic Disorders/Post MI/Prophylaxis MI  Instructions:  Initiate Bleeding Precautions Protocol--DO NOT CRUSH, 16-Jan-2013, Discontinued, Standard   DULoxetine capsule, 60 mg Oral daily  - Indication: Depression, 16-Jan-2013, Discontinued, Standard   Bisacodyl EC tablet, ( Dulcolax EC)  10 mg Oral daily PRN for constipation  - Indication: Constipation/ Bowel Prep for Surgery or  Exam  Instructions:  DO NOT CRUSH, 16-Jan-2013, Discontinued, Standard   Ferrous Sulfate tablet, ( FeSO4)  325 mg Oral daily  - Indication: Iron Deficiency/ Anemia, 16-Jan-2013, Discontinued, Standard   Silver Sulfadiazine Cream, Apply to affected area daily  Instructions: [Waste Code: Black], 16-Jan-2013, Discontinued, Standard   Piperacillin-Tazobactam injection,  ( Zosyn )  3.375 gram, IV Piggyback, q12h, Infuse over 4 hour(s)  Indication: Infection  Pharmacy dose per Zosyn Ext Inf Protocol, <<<Extended infusion protocol>>>, 16-Jan-2013, Discontinued, Standard   Insulin Aspart Injection,  ( NovoLOG )  10 unit(s), Subcutaneous, STAT  Indication: Diabetes, [Waste Code: Black], 16-Jan-2013, Completed, Standard   Insulin Glargine (Human) injection,  ( Lantus )  8 unit(s), Subcutaneous, STAT  Indication: Diabetes, [Waste Code: Black], 16-Jan-2013, Completed, Standard   Insulin Glargine (Human) injection,  ( Lantus )  10 unit(s), Subcutaneous, at bedtime  Indication: Diabetes, [Waste Code: Black], 16-Jan-2013, Discontinued, Standard   Acetaminophen * tablet,  ( Tylenol (325 mg) tablet)  325 mg Oral once  - Indication: Pain/Fever, 16-Jan-2013, Completed, Standard   meTOProlol tartrate tablet, (  Lopressor)  12.5 mg Oral q8h  - Indication: Antihypertensive/ Angina, 16-Jan-2013, Discontinued, Standard   atorvaSTATin tablet, 20 mg Oral daily  - Indication: Hypercholesterolemia, 16-Jan-2013, Discontinued, Standard   Vancomycin  injection,  ( Vancocin injection )  1250 mg in Dextrose 5% 250 ml, IV Piggyback, q48h, Infuse over 90 minute(s)  Indication: Infection, 16-Jan-2013, Discontinued, Standard   Piperacillin-Tazobactam injection, ( Zosyn )  4.5 gram, IV Piggyback, q8h, Infuse over 4 hour(s)  Indication: Infection  Pharmacy dose per Zosyn Ext Inf Protocol, <<<Extended infusion protocol>>>, 17-Jan-2013, Discontinued, Standard   Insulin Glargine (Human) injection,  ( Lantus  )  20 unit(s), Subcutaneous, at bedtime  Indication: Diabetes, [Waste Code: Black], 17-Jan-2013, Discontinued, Standard   Vancomycin  injection,  ( Vancocin injection )  750 mg in Dextrose 5% 150 ml, IV Piggyback, q24h, Infuse over 60 minute(s)  Indication: Infection, 18-Jan-2013, Discontinued, Standard   fentaNYL injection, ( Sublimaze injection )  12.5 to 25 mcg, IV push, every 5 - 10 minute(s) PRN for pain  Indication: Pain, -MAX TOTAL DOSAGE:    100   mcg.  PACU Only  [Med Admin Window: 30 mins before or after scheduled dose], 18-Jan-2013, Discontinued, Standard   Meperidine injection, ( Demerol injection )  12.5 mg, IV push, every 5 - 10 minute(s) PRN for pain.  Stop After:  4 Doses  Indication: Pain, PACU Only.  For severe pain unresponsive to Fentanyl.  Max 77m  [Med Admin Window: 30 mins before or after scheduled dose], 18-Jan-2013, Discontinued, Standard   Ondansetron injection, ( Zofran injection )  4 mg, IV push, once PRN for nausea, vomiting.  Stop After:  1 Days  Indication: Nausea, PACU Only, 18-Jan-2013, Discontinued, Standard   Acetaminophen-oxyCODONE 325/5 mg tablet, ( Tylox 5-325)  1 to 2 tablet(s) Oral q4h PRN for pain  - Indication: Pain  Instructions:  [Med Admin Window: 30 mins before or after scheduled dose], 18-Jan-2013, Active, Standard   Aspirin Enteric Coated tablet, ( Ecotrin)  81 mg Oral daily  - Indication: Pain/Fever/Thromboembolic Disorders/Post MI/Prophylaxis MI  Instructions:  Initiate Bleeding Precautions Protocol--DO NOT CRUSH, 18-Jan-2013, Active, Standard   atorvaSTATin tablet, 20 mg Oral daily  - Indication: Hypercholesterolemia, 18-Jan-2013, Active, Standard   Bisacodyl EC tablet, ( Dulcolax EC)  10 mg Oral daily PRN for constipation  - Indication: Constipation/ Bowel Prep for Surgery or Exam  Instructions:  DO NOT CRUSH, 18-Jan-2013, Active, Standard   DULoxetine capsule, 60 mg Oral daily  - Indication: Depression, 18-Jan-2013,  Active, Standard   Ferrous Sulfate tablet, ( FeSO4)  325 mg Oral daily  - Indication: Iron Deficiency/ Anemia, 18-Jan-2013, Active, Standard   HePARin injection, 5000 unit(s), Subcutaneous, q12h  Indication: Anticoagulant, Monitor Anticoags per hospital protocol, 18-Jan-2013, Discontinued, Standard   Insulin Glargine (Human) injection,  ( Lantus )  20 unit(s), Subcutaneous, at bedtime  Indication: Diabetes, [Waste Code: Black], 18-Jan-2013, Active, Standard   Insulin SS -Novolog injection, Subcutaneous, FSBS tid/ac  0 units if FSBS 0-150     2 unit(s) if FSBS 151 - 200     4 unit(s) if FSBS 201 - 250     6 unit(s) if FSBS 251 - 300     8 unit(s) if FSBS 301 - 350     10 unit(s) if FSBS 351 - 400  Call MD if FSBS is greater than 400, [Waste Code: Black], 18-Jan-2013, Active, Standard   MorphINE  injection, 2 to 4 mg, IV push, q2h PRN for pain  Indication: Pain, [Med Admin Window:  30 mins before or after scheduled dose], 18-Jan-2013, Active, Standard   Ondansetron disintegrating tablet, ( Zofran ODT)  4 mg Oral q6h PRN for nausea, vomiting  - Indication: Nausea/ Vomiting, 18-Jan-2013, Active, Standard   Pantoprazole tablet, 40 mg Oral q6am  - Indication: Erosive Esophagitis/ GERD  Instructions:  DO NOT CRUSH, 18-Jan-2013, Active, Standard   Piperacillin-Tazobactam injection, ( Zosyn )  4.5 gram, IV Piggyback, q8h, Infuse over 4 hour(s)  Indication: Infection  Pharmacy dose per Zosyn Ext Inf Protocol, <<<Extended infusion protocol>>>, 18-Jan-2013, Active, Standard   Silver Sulfadiazine Cream, Apply to affected area daily  Instructions: Dustin Folks Code: Black], 18-Jan-2013, Active, Standard   Vancomycin  injection,  ( Vancocin injection )  1000 mg in Dextrose 5% 250 ml, IV Piggyback, q24h, Infuse over 60 minute(s)  Indication: Infection, 19-Jan-2013, Discontinued, Standard   Nitroglycerin tablet, ( Nitrostat SL)  0.4 mg Sublingual Q5M PRN for chest pain  - Indication: Angina/  Hypertension  Instructions:  q 5 minutes x 3 doses PRN, 20-Jan-2013, Active, Standard   Nitroglycerin 2% ointment, 1 inch(es) Topical bid  -Indication:Angina/ Hypertension  Instructions:  Apply daily at 6 am, 12 noon and remove at 6 pm to begin 12 hour nitrate free period., 20-Jan-2013, Active, Standard   Nitroglycerin Ointment/Patch - REMOVAL, 6pm, 20-Jan-2013, Active, Standard   Vancomycin  injection,  ( Vancocin injection )  1000 mg in Dextrose 5% 250 ml, IV Piggyback, once, Infuse over 60 minute(s)  Indication: Infection, 20-Jan-2013, Completed, Standard   meTOProlol tartrate tablet, ( Lopressor)  25 mg Oral q6h  - Indication: Antihypertensive/ Angina  Instructions:  hold fro SBP<100, HR<60, 20-Jan-2013, Active, Standard   Nitroglycerin Ointment/Patch - REMOVAL, 6pm, 20-Jan-2013, Active, Standard   Nitroglycerin patch, 0.2 mg Topical daily  -Indication:Angina/ Hypertension  Instructions:  Apply daily at 6 am and remove at 6 pm to begin 12 hour nitrate free period., 20-Jan-2013, Active, Standard   HePARin injection, 5000 unit(s), Subcutaneous, q8h  Indication: Anticoagulant, Monitor Anticoags per hospital protocol, 20-Jan-2013, Active, Standard   Vancomycin  injection,  ( Vancocin injection )  1000 mg in Dextrose 5% 250 ml, IV Piggyback, q18h, Infuse over 60 minute(s)  Indication: Infection, 21-Jan-2013, Active, Standard  Home Medications: Medication Instructions Status  Metoprolol Tartrate 50 mg oral tablet 1 tab(s) orally 2 times a day Active  gabapentin 300 mg oral capsule 1 cap(s) orally 3 times a day Active  Cymbalta 60 mg oral delayed release capsule 1 cap(s) orally once a day Active  clopidogrel 75 mg oral tablet 1 tab(s) orally once a day Active  furosemide 20 mg oral tablet 0.5 tab(s) orally every other day  Active  atorvastatin 40 mg oral tablet 1 tab(s) orally once a day (at bedtime) Active  novolin r   sliding scale before meals and and hour of sleep Active   Thermazene 1% topical cream Apply topically to affected area 2 times a day Active  metFORMIN 1000 mg oral tablet 1 tab(s) orally 2 times a day Active  famotidine 20 mg oral tablet 1 tab(s) orally 2 times a day Active  doxycycline hyclate hyclate 100 mg oral tablet 1 tab(s) orally 2 times a day Active  Combivent Respimat CFC free 100 mcg-20 mcg/inh inhalation aerosol 2 puff(s) inhaled every 6 hours, As Needed - for Shortness of Breath Active  ferrous sulfate 325 mg oral tablet 1 tab(s) orally once a day Active  isosorbide mononitrate extended release 60 mg oral tablet, extended release 1 tab(s) orally once a day (in the morning)  Active  Aspirin Enteric Coated 81 mg oral delayed release tablet 1 tab(s) orally once a day Active  silver sulfADIAZINE topical 1% topical cream Apply topically to affected area once a day Active  NovoLIN N human recombinant 100 units/mL subcutaneous suspension 17 unit(s) subcutaneous 2 times a day, before breakfast and supper Active  traZODone 50 mg oral tablet 1 tab(s) orally once (at bedtime) Active  promethazine 25 mg oral tablet 1 tab(s) orally every 6 hours, As Needed - for Nausea, Vomiting Active  bisacodyl 5 mg oral delayed release tablet 2 tab(s) orally once a day, As Needed - for Constipation Active  Robafen 100 mg/5 mL oral liquid 10 milliliter(s) orally every 6 hours, As Needed Active  oxyCODONE 5 mg oral tablet 1 tab(s) orally every 4 hours, As Needed - for Pain Active  Nitrostat 0.4 mg sublingual tablet 1 tab(s) sublingual every 5 minutes x 3, As Needed - for Chest Pain Active  Mapap 325 mg oral tablet 2 tab(s) orally 4 times a day Active  docusate sodium sodium 100 mg oral capsule 1 cap(s) orally 3 times a day Active   Lab Results:  LabObservation:  20-Apr-14 13:13   OBSERVATION Reason for Test  Hepatic:  16-Apr-14 01:01   Bilirubin, Total  1.1  Alkaline Phosphatase  185  SGPT (ALT) 22  SGOT (AST) 34  Total Protein, Serum 7.8  Albumin, Serum  2.2   17-Apr-14 05:51   Bilirubin, Total 1.0  Alkaline Phosphatase  209  SGPT (ALT)  83  SGOT (AST)  126  Total Protein, Serum  6.2  Albumin, Serum  1.5  Routine Micro:  16-Apr-14 01:04   Micro Text Report BLOOD CULTURE   COMMENT                   NO GROWTH IN 48 HOURS   ANTIBIOTIC                       Micro Text Report BLOOD CULTURE   COMMENT                   NO GROWTH IN 48 HOURS   ANTIBIOTIC                       Culture Comment NO GROWTH IN 48 HOURS  Result(s) reported on 18 Jan 2013 at 07:19AM.  Culture Comment NO GROWTH IN 48 HOURS  Result(s) reported on 18 Jan 2013 at 07:19AM.  18-Apr-14 14:22   Organism Name STREPTOCOCCUS PYOGENES (GROUP A)  Organism Quantity HEAVY GROWTH  Micro Text Report WOUND AER/ANAEROBIC CULT   ORGANISM 1                HEAVY GROWTH STREPTOCOCCUS PYOGENES (GROUP A)   COMMENT                   CALL LAB IF SENSITIVITY TESTING REQUIRED   GRAM STAIN                RARE WHITE BLOOD CELLS   GRAM STAINNO ORGANISMS SEEN   ANTIBIOTIC                       Specimen Source RIGHT LEG WOUND  Organism 1 HEAVY GROWTH STREPTOCOCCUS PYOGENES (GROUP A)  Culture Comment CALL LAB IF SENSITIVITY TESTING REQUIRED  Gram Stain 1 RARE WHITE BLOOD CELLS  Gram Stain 2 NO ORGANISMS SEEN  General Ref:  16-Apr-14 17:27   Complement C3, Serum ========== TEST NAME ==========  ========= RESULTS =========  = REFERENCE RANGE =  COMPLEMENT C3, SERUM  Complement C3, Serum Complement C3, Serum            [   136 mg/dL Adult      ]            90-180               [facility]    No: [id number]           [address], [place], [place] [address]           Lindon Romp, MD         [phone]   Result(s) reported on 17 Jan 2013 at 04:50PM.  Complement C4 ========== TEST NAME ==========  ========= RESULTS =========  = REFERENCE RANGE =  COMPLEMENT C4  Complement C4, Serum Complement C4, Serum            [   29 mg/dL Adult       ]              [address]            No: [id number]           [address], [place], [place] [address]           Lindon Romp, MD         [phone]   Result(s) reported on 17 Jan 2013 at 04:50PM.  ANA w/Reflex to 59 Conf.Tests ========== TEST NAME ==========  ========= RESULTS =========  = REFERENCE RANGE =  ANA W/REFLEX-9 CONF.TEST  ANA w/Reflex if Positive ANA Direct                      [   Negative             ]          Negative               [facility]            No: [id number]           [address], [place], [place] [address]           Lindon Romp, MD         [phone]   Result(s) reported on 17 Jan 2013 at 03:08PM.  ANCA Panel ========== TEST NAME ==========  ========= RESULTS =========  = REFERENCE RANGE =  ANCA PANEL  ANCA Panel Antimyeloperoxidase (MPO) Abs   [   <9.0 U/mL            ]           0.0-9.0 Antiproteinase 3 (PR-3) Abs     [   <3.5 U/mL            ]       0.0-3.5 Cytoplasmic (C-ANCA)            [   <1:20 titer          ]         Neg:<1:20 Perinuclear (P-ANCA)            [   <1:20 titer          ]         Neg:<1:20 The presence of positive  fluorescence exhibiting P-ANCA or C-ANCA patterns alone is not specific for the diagnosis of Wegener's Granulomatosis (WG) or microscopic polyangiitis. Decisions about treatment should not be based solely on ANCA IFA results.  The International ANCA Group Consensus recommends follow up testing of positive sera with both PR-3 and MPO-ANCA enzyme immunoassays. As many as 5% serum samples are positive only by EIA. Ref. AM J Clin Pathol 1999;111:507-513. Atypical pANCA                  [   <1:20 titer          ]         Neg:<1:20 The atypical pANCA pattern has been observed in a significant percentage of patients with ulcerative colitis, primary sclerosing cholangitis and autoimmune hepatitis.               [facility]            No: [id number]           [address],  [place], [place] [address]           Lindon Romp, MD         [phone]   Result(s) reported on 17 Jan 2013 at 04:50PM.  Antiglomerular Base Membrane ========== TEST NAME ==========  ========= RESULTS =========  = REFERENCE RANGE =  ANTIGLOMERULAR BASE MEM.  Antiglomerular BM Ab Antiglomerular BM Ab            [   4 units              ]              0-20                                   Negative                   0 - 20                                   Weak Positive             21 - 30                                   Moderate to Strong Positive   >30               [facility]            No: [id number]           [address], [place], [place] [address]           Lindon Romp, MD         [phone]   Result(s) reported on 17 Jan 2013 at 04:50PM.  Protein Electrophoresis, Serum ========== TEST NAME ==========  ========= RESULTS =========  = REFERENCE RANGE =  PROTEIN ELECTROPHORESIS  Protein Electro.,S Protein, Total, Serum           [L  5.2 g/dL             ]           6.0-8.5 Albumin                         [  L  1.9 g/dL             ]           3.2-5.6 Alpha-1-Globulin                [H  0.5 g/dL             ]           0.1-0.4 Alpha-2-Globulin                [   1.1 g/dL             ]           0.4-1.2 Beta Globulin                   [   0.7 g/dL             ]           0.6-1.3 Gamma Globulin                  [   1.0 g/dL             ]           0.5-1.6 M-Spike                         [   Not Observed g/dL    ]      Not Observed Globulin, Total                 [   3.3 g/dL             ]           2.0-4.5 A/G Ratio                       [L  0.6                  ]           0.7-2.0 Please note:                    [   Final Report         ]                   Protein electrophoresis scan will follow via computer, mail, or courier delivery.             [facility]            No: [id number]           [address], [place], [place] [address]            Lindon Romp, MD         [phone]   Result(s) reported on 17 Jan 2013 at 04:50PM.    21:43   Eosinophil, Urine ========== TEST NAME ==========  ========= RESULTS =========  = REFERENCE RANGE =  EOSINOPHIL, URINE  Eosinophil, Urine Eosinophil, Urine               [   No Eosinophils Seen %]                    <5% few or none seen               [facility]  No: [id number]           [address], [place], [place] [address]           Lindon Romp, MD         [phone]   Result(s) reported on 18 Jan 2013 at 06:19PM.  Protein Electrophoresis, Urine Random ========== TEST NAME ==========  ========= RESULTS =========  = REFERENCE RANGE =  UR PROT ELECTROPH-RANDOM  Protein Electro, Random Urine Protein,Total,Urine             [H  50.1 mg/dL           ]          0.0-15.0 Albumin, U   [   36.6 %               ]                   Alpha-1-Globulin, U             [   2.9 %                ]                   Alpha-2-Globulin, U             [   16.1 %               ]                   Beta Globulin, U                [   15.8 %           ]                   Gamma Globulin, U               [   28.6 %               ]                   M-Spike, %                      [   Not Observed %       ]      Not Observed Please note:                    [   Final Report         ]              Protein electrophoresis scan will follow via computer, mail, or courier delivery.               [facility]            No: [id number]           [address] [place], [place] [address]           Lindon Romp, MD     [phone]   Result(s) reported on 18 Jan 2013 at 06:19PM.  Cardiology:  16-Apr-14 01:19   Ventricular Rate 115  Atrial Rate 115  P-R Interval 160  QRS Duration 76  QT 330  QTc 456  P Axis 62  R Axis 51  T Axis 105  ECG interpretation Sinus tachycardia Otherwise normal ECG No previous ECGs  available ----------unconfirmed----------  Confirmed by OVERREAD, NOT ([id number]), editor PEARSON, BARBARA ([id number]) on 01/17/2013 2:23:41 PM    06:19   Ventricular Rate 149  Atrial Rate 149  P-R Interval 186  QRS Duration 76  QT 264  QTc 415  P Axis 64  R Axis 51  T Axis -150  ECG interpretation Supraventricular tachycardia  Possible Left atrial enlargement Septal infarct , age undetermined Marked ST abnormality, possible inferior subendocardial injury Abnormal ECG No previous ECGs available Confirmed by Humphrey Rolls, SHAUKAT ([id number]) on 01/16/2013 1:44:46 PM  Overreader: Humphrey Rolls, Earlyne Iba  20-Apr-14 13:13   Echo Doppler REASON FOR EXAM:     COMMENTS:     PROCEDURE: [place] - ECHO DOPPLER COMPLETE(TRANSTHOR)  - Jan 20 2013  1:13PM   RESULT: Echocardiogram Report  Patient Name:   [name] Date of Exam: 01/20/2013 Medical Rec #:  [id number]         Custom1: Date of Birth:  [birth year]       Height:       64.0 in Patient Age:    53 years       Weight:       220.0 lb Patient Gender: U              BSA:          2.04 m??  Indications: Angina Sonographer:    JERRY HEGE RDCS Referring Phys: Phillips Climes, S  Sonographer Comments: Technically very difficult study due to very poor  echo windows. Pt could not be positioned on left side due to right leg  injury. History of CABG  Summary:  1. Left ventricular ejection fraction, by visual estimation, is50 to  55%.  2. Normal global left ventricular systolic function.  3. Normal left ventricular size and wall thicknesses, with normal  systolic and diastolic function.  4. Normal right ventricular size and systolic function.  5. Normal RVSP. 2DAND M-MODE MEASUREMENTS (normal ranges within parentheses): Left Ventricle:          Normal IVSd (2D):      0.81 cm (0.7-1.1) LVPWd (2D):     1.06 cm (0.7-1.1) Aorta/LA:                  Normal LVIDd (2D):     4.46 cm (3.4-5.7) Aortic Root (2D): 2.80 cm (2.4-3.7) LVIDs (2D):     3.55 cm           Left Atrium (2D):  3.10 cm (1.9-4.0) LV FS (2D):     20.4 %   (>25%) LV EF (2D):     41.9 %   (>50%)                                   Right Ventricle:                                   RVd (2D): LV DIASTOLIC FUNCTION: MV Peak E: 1.56 m/s E/e' Ratio: 20.50 MV Peak A: 1.01 m/s Decel Time: 190 msec E/A Ratio: 1.54 SPECTRAL DOPPLER ANALYSIS (where applicable): Mitral Valve: MV P1/2 Time: 55.10 msec MV Area, PHT: 3.99 cm?? Aortic Valve: AoV Max Vel: 1.24 m/s AoV Peak PG: 6.2 mmHg AoV Mean PG: LVOT Vmax: 0.94 m/s LVOT VTI:  LVOT Diameter: 2.00 cm AoV Area, Vmax: 2.38 cm?? AoV Area, VTI:  AoV Area, Vmn: Tricuspid Valve and PA/RV Systolic Pressure:  TR Max Velocity: 1.92 m/s RA  Pressure: 5 mmHg RVSP/PASP: 19.7 mmHg  PHYSICIAN INTERPRETATION: Left Ventricle: Normal left ventricular size and wall thicknesses, with  normal systolic and diastolic function. The left ventricular internal  cavity size was normal. LV posterior wall thickness was normal. No left  ventricular hypertrophy. Global LV systolic function was normal. Left  ventricular ejection fraction, by visual estimation, is 50 to 55%.  Spectral Doppler shows normal pattern of LV diastolic filling. Right Ventricle: Normal right ventricular size, wall thickness, and  systolic function. The right ventricular size is normal. Global RV  systolic function is normal. Left Atrium: The left atrium is normal in size and structure. The left  atrium is normal in size. Right Atrium: The right atrium is normal in size. Pericardium: There is no evidence of pericardial effusion. Mitral Valve: Structurally normal mitral valve, with normal leaflet  excursion; without any evidence of mitral stenosis or significant  regurgitation. The mitral valve is normal in structure. Trace mitral  valve regurgitation is seen. Tricuspid Valve: Structurally normal tricuspid valve, with normal leaflet  excursion. The tricuspid valve is normal. Trivial tricuspid regurgitation is  visualized. The tricuspid regurgitant velocity is 1.92 m/s, and with  an assumed right atrial pressure of 5 mmHg, the estimated right   ventricular systolic pressure is normal at 19.7 mmHg. Aortic Valve: The aortic valve was not well seen. The aortic valve is  structurally normal, with no evidence of sclerosis or stenosis. No  evidence of aortic valve regurgitation is seen. Pulmonic Valve: Structurally normal pulmonic valve, with normal leaflet  excursion. The pulmonic valve is normal. Aorta: The aortic root is normal in size and structure.  [id number] Ida Rogue MD Electronically signed by [id number] Ida Rogue MD Signature Date/Time: 01/20/2013/8:16:10 PM  *** Final ***  IMPRESSION: .  Verified By: Minna Merritts, M.D., MD  Routine Chem:  16-Apr-14 01:01   Result Comment POTASSIUM/AST - Slight hemolysis, interpret results with  - caution...tpl  Result(s) reported on 16 Jan 2013 at 02:00AM.  Glucose, Serum  398  BUN  34  Creatinine (comp)  3.32  Sodium, Serum  123  Potassium, Serum 4.1  Chloride, Serum  89  CO2, Serum 26  Calcium (Total), Serum  8.0  Anion Gap 8  Osmolality (calc) 272  eGFR (African American)  18  eGFR (Non-African American)  15 (eGFR values <42m/min/1.73 m2 may be an indication of chronic kidney disease (CKD). Calculated eGFR is useful in patients with stable renal function. The eGFR calculation will not be reliable in acutely ill patients when serum creatinine is changing rapidly. It is not useful in  patients on dialysis. The eGFR calculation may not be applicable to patients at the low and high extremes of body sizes, pregnant women, and vegetarians.)    11:28   Glucose, Serum  203  BUN  34  Creatinine (comp)  2.99  Sodium, Serum  126  Potassium, Serum 3.7  Chloride, Serum  97  CO2, Serum 23  Calcium (Total), Serum  7.2  Anion Gap  6  Osmolality (calc) 267  eGFR (African American)  20  eGFR (Non-African American)  18 (eGFR values  <633mmin/1.73 m2 may be an indication of chronic kidney disease (CKD). Calculated eGFR is useful in patients with stable renal function. The eGFR calculation will not be reliable in acutely ill patients when serum creatinine is changing rapidly. It is not useful in  patients on dialysis. The eGFR calculation may not be applicable to patients at  the low and high extremes of body sizes, pregnant women, and vegetarians.)  Hemoglobin A1c ([facility])  9.3 (The American Diabetes Association recommends that a primary goal of therapy should be <7% and that physicians should reevaluate the treatment regimen in patients with HbA1c values consistently >8%.)    17:27   Ferritin ([facility]) 133 (Result(s) reported on 16 Jan 2013 at [facility].)  Iron Binding Capacity (TIBC)  163  Unbound Iron Binding Capacity 149  Iron, Serum  14  Iron Saturation 9 (Result(s) reported on 16 Jan 2013 at 06:16PM.)  17-Apr-14 05:51   Glucose, Serum  259  BUN  35  Creatinine (comp)  2.27  Sodium, Serum  129  Potassium, Serum 4.1  Chloride, Serum 99  CO2, Serum 24  Calcium (Total), Serum  7.6  Anion Gap  6  Osmolality (calc) 276  eGFR (African American)  28  eGFR (Non-African American)  25 (eGFR values <26m/min/1.73 m2 may be an indication of chronic kidney disease (CKD). Calculated eGFR is useful in patients with stable renal function. The eGFR calculation will not be reliable in acutely ill patients when serum creatinine is changing rapidly. It is not useful in  patients on dialysis. The eGFR calculation may not be applicable to patients at the low and high extremes of body sizes, pregnant women, and vegetarians.)  18-Apr-14 04:51   Glucose, Serum  219  BUN  30  Creatinine (comp)  1.73  Sodium, Serum  133  Potassium, Serum 4.0  Chloride, Serum 101  CO2, Serum 26  Calcium (Total), Serum  8.2  Anion Gap  6  Osmolality (calc) 279  eGFR (African American)  39  eGFR (Non-African American)  34 (eGFR values  <65mmin/1.73 m2 may be an indication of chronic kidney disease (CKD). Calculated eGFR is useful in patients with stable renal function. The eGFR calculation will not be reliable in acutely ill patients when serum creatinine is changing rapidly. It is not useful in  patients on dialysis. The eGFR calculation may not be applicable to patients at the low and high extremes of body sizes, pregnant women, and vegetarians.)    14:22   Result Comment GROUP "A" STREP - There is no known Penicillin Resistant  - Beta Streptococcus in the [place]  For   - patients that are Penicillin-allergic,  - Erythromycin is 85-94% susceptible, and  - Clindamycin is 80% susceptible.  Contact   - Microbiology within 7 days if   - sensitivity testing is required.  Result(s) reported on 20 Jan 2013 at 09:50AM.  19-Apr-14 05:09   Glucose, Serum  206  BUN  20  Creatinine (comp)  1.32  Sodium, Serum  135  Potassium, Serum 4.0  Chloride, Serum 103  CO2, Serum 23  Calcium (Total), Serum  7.8  Anion Gap 9  Osmolality (calc) 279  eGFR (African American)  55  eGFR (Non-African American)  47 (eGFR values <6079min/1.73 m2 may be an indication of chronic kidney disease (CKD). Calculated eGFR is useful in patients with stable renal function. The eGFR calculation will not be reliable in acutely ill patients when serum creatinine is changing rapidly. It is not useful in  patients on dialysis. The eGFR calculation may not be applicable to patients at the low and high extremes of body sizes, pregnant women, and vegetarians.)  20-Apr-14 06:01   Glucose, Serum 88  BUN 11  Creatinine (comp) 1.09  Sodium, Serum 138  Potassium, Serum 3.8  Chloride, Serum 105  CO2, Serum 26  Calcium (Total),  Serum  7.8  Anion Gap 7  Osmolality (calc) 274  eGFR (African American) >60  eGFR (Non-African American)  60 (eGFR values <62m/min/1.73 m2 may be an indication of chronic kidney disease (CKD). Calculated eGFR is useful in  patients with stable renal function. The eGFR calculation will not be reliable in acutely ill patients when serum creatinine is changing rapidly. It is not useful in  patients on dialysis. The eGFR calculation may not be applicable to patients at the low and high extremes of body sizes, pregnant women, and vegetarians.)    21:53   Result Comment TROPONIN - RESULTS VERIFIED BY REPEAT TESTING.  - C/MICKEY BARNES AT 2235 01/20/13.PMH  - READ-BACK PROCESS PERFORMED.  Result(s) reported on 20 Jan 2013 at 10:38PM.  21-Apr-14 05:41   Result Comment TROPONIN - RESULTS VERIFIED BY REPEAT TESTING.  - PREV. C/ 01-20-13 _0  BY [facility]  - AJO  Result(s) reported on 21 Jan 2013 at 07:10AM.  Glucose, Serum 83  BUN 8  Creatinine (comp) 0.93  Sodium, Serum 137  Potassium, Serum 3.5  Chloride, Serum 104  CO2, Serum 27  Calcium (Total), Serum  7.8  Anion Gap  6 (Result(s) reported on 21 Jan 2013 at 06:18AM.)  Osmolality (calc) 271  Misc Urine Chem:  16-Apr-14 21:43   Creatinine, Urine 83.4  Protein, Random Urine  79  Protein/Creat Ratio (comp)  947 (Result(s) reported on 16 Jan 2013 at 10:35PM.)  Cardiac:  20-Apr-14 12:02   CPK-MB, Serum 1.9 (Result(s) reported on 20 Jan 2013 at 12:51PM.)  CK, Total 87    21:53   Troponin I  0.36 (0.00-0.05 0.05 ng/mL or less: NEGATIVE  Repeat testing in 3-6 hrs  if clinically indicated. >0.05 ng/mL: POTENTIAL  MYOCARDIAL INJURY. Repeat  testing in 3-6 hrs if  clinically indicated. NOTE: An increase or decrease  of 30% or more on serial  testing suggests a  clinically important change)  CPK-MB, Serum 1.6 (Result(s) reported on 20 Jan 2013 at 10:27PM.)  21-Apr-14 05:41   Troponin I  0.44 (0.00-0.05 0.05 ng/mL or less: NEGATIVE  Repeat testing in 3-6 hrs  if clinically indicated. >0.05 ng/mL: POTENTIAL  MYOCARDIAL INJURY. Repeat  testing in 3-6 hrs if  clinically indicated. NOTE: An increase or decrease  of 30% or more on serial  testing suggests  a  clinically important change)  CPK-MB, Serum 1.4 (Result(s) reported on 21 Jan 2013 at 06:50AM.)  Routine UA:  16-Apr-14 03:40   Color (UA) Amber  Clarity (UA) Cloudy  Glucose (UA) 150 mg/dL  Bilirubin (UA) Negative  Ketones (UA) Negative  Specific Gravity (UA) 1.021  Blood (UA) Negative  pH (UA) 5.0  Protein (UA) 100 mg/dL  Nitrite (UA) Negative  Leukocyte Esterase (UA) Negative (Result(s) reported on 16 Jan 2013 at 04:33AM.)  RBC (UA) 6 /HPF  WBC (UA) 12 /HPF  Bacteria (UA) 1+  Epithelial Cells (UA) 3 /HPF  Mucous (UA) PRESENT  Hyaline Cast (UA) 19 /LPF  Amorphous Crystal (UA) PRESENT (Result(s) reported on 16 Jan 2013 at 04:33AM.)  Routine Coag:  17-Apr-14 05:51   Prothrombin  16.5  INR 1.3 (INR reference interval applies to patients on anticoagulant therapy. A single INR therapeutic range for coumarins is not optimal for all indications; however, the suggested range for most indications is 2.0 - 3.0. Exceptions to the INR Reference Range may include: Prosthetic heart valves, acute myocardial infarction, prevention of myocardial infarction, and combinations of aspirin and anticoagulant. The need for a higher or lower  target INR must be assessed individually. Reference: The Pharmacology and Management of the Vitamin K  antagonists: the seventh ACCP Conference on Antithrombotic and Thrombolytic Therapy. JOINO.6767 Sept:126 (3suppl): [id number]. A HCT value >55% may artifactually increase the PT.  In one study,  the increase was an average of 25%. Reference:  "Effect on Routine and Special Coagulation Testing Values of Citrate Anticoagulant Adjustment in Patients with High HCT Values." American Journal of Clinical Pathology 2006;126:400-405.)  Routine Hem:  16-Apr-14 01:01   WBC (CBC)  32.4  RBC (CBC) 3.94  Hemoglobin (CBC)  11.9  Hematocrit (CBC) 35.4  Platelet Count (CBC) 307  MCV 90  MCH 30.1  MCHC 33.5  RDW 14.3  Neutrophil % 91.1  Lymphocyte % 4.8   Monocyte % 3.5  Eosinophil % 0.1  Basophil % 0.5  Neutrophil #  29.5  Lymphocyte # 1.6  Monocyte #  1.1  Eosinophil # 0.0  Basophil #  0.2 (Result(s) reported on 16 Jan 2013 at 02:00AM.)  17-Apr-14 05:51   WBC (CBC)  27.4  RBC (CBC)  3.38  Hemoglobin (CBC)  10.2  Hematocrit (CBC)  30.2  Platelet Count (CBC) 224  MCV 89  MCH 30.2  MCHC 33.8  RDW 13.9  Neutrophil % 90.7  Lymphocyte % 3.3  Monocyte % 5.3  Eosinophil % 0.6  Basophil % 0.1  Neutrophil #  24.9  Lymphocyte #  0.9  Monocyte #  1.4  Eosinophil # 0.2  Basophil # 0.0 (Result(s) reported on 17 Jan 2013 at [facility].)  18-Apr-14 04:51   WBC (CBC)  25.0  RBC (CBC)  3.32  Hemoglobin (CBC)  10.0  Hematocrit (CBC)  29.7  Platelet Count (CBC) 253  MCV 89  MCH 30.1  MCHC 33.7  RDW 14.2  Neutrophil % 87.5  Lymphocyte % 5.9  Monocyte % 5.0  Eosinophil % 1.4  Basophil % 0.2  Neutrophil #  21.8  Lymphocyte # 1.5  Monocyte #  1.2  Eosinophil # 0.3  Basophil # 0.0 (Result(s) reported on 18 Jan 2013 at 05:59AM.)  19-Apr-14 05:09   WBC (CBC)  19.2  RBC (CBC)  2.88  Hemoglobin (CBC)  8.7  Hematocrit (CBC)  25.7  Platelet Count (CBC) 262  MCV 90  MCH 30.2  MCHC 33.7  RDW 14.1  Neutrophil % 85.7  Lymphocyte % 7.5  Monocyte % 5.3  Eosinophil % 1.0  Basophil % 0.5  Neutrophil #  16.5  Lymphocyte # 1.4  Monocyte #  1.0  Eosinophil # 0.2  Basophil # 0.1 (Result(s) reported on 19 Jan 2013 at 05:42AM.)  20-Apr-14 06:01   WBC (CBC)  18.5  RBC (CBC)  2.90  Hemoglobin (CBC)  8.7  Hematocrit (CBC)  25.9  Platelet Count (CBC) 302  MCV 89  MCH 29.9  MCHC 33.5  RDW 14.1  Neutrophil % 84.0  Lymphocyte % 8.3  Monocyte % 6.5  Eosinophil % 0.7  Basophil % 0.5  Neutrophil #  15.5  Lymphocyte # 1.5  Monocyte #  1.2  Eosinophil # 0.1  Basophil # 0.1 (Result(s) reported on 20 Jan 2013 at 06:46AM.)  21-Apr-14 05:41   WBC (CBC)  19.6  RBC (CBC)  2.90  Hemoglobin (CBC)  8.8  Hematocrit (CBC)  26.1  Platelet  Count (CBC) 364  MCV 90  MCH 30.2  MCHC 33.6  RDW 14.2  Neutrophil % 81.9  Lymphocyte % 11.1  Monocyte % 5.9  Eosinophil % 0.9  Basophil % 0.2  Neutrophil #  16.1  Lymphocyte # 2.2  Monocyte #  1.2  Eosinophil # 0.2  Basophil # 0.0 (Result(s) reported on 21 Jan 2013 at 06:18AM.)   EKG:  Interpretation NSR, Q wave V1-V2, ST scooping V-V6, II, aVF, IVCD III, aVF, TWI/flattening I, aVL   Rate 102   EKG Comparision Not changed from  EKG 01/16/13 at 1:19   Radiology Results: XRay:    16-Apr-14 01:50, Chest Portable Single View  Chest Portable Single View   REASON FOR EXAM:    tachycardia + hypotension  COMMENTS:       PROCEDURE: DXR - DXR PORTABLE CHEST SINGLE VIEW  - Jan 16 2013  1:50AM     RESULT:     Findings:  The patient has taken a shallow inspiration. An area of   increased density projects within the left upper lobe. This may be   secondary to osteoarthritic changes involving the costosternal   articulation. A nodule within this area cannot be excluded. Repeat   evaluation with PA and lateral views is recommended. The cardiac   silhouette and visualized bony skeleton is unremarkable.    IMPRESSION:    1.  Left upper lobe nodular density as described above.  2.  No further evidence of focal infiltrates, effusions or edema.   3.  Note, the left upper lobe finding may also represent atelectasis   and/or infiltrate.      Thank you for this opportunity to contribute to the care of your patient.         Verified By: Mikki Santee, M.D., MD    16-Apr-14 02:17, Foot Right AP and Lateral  Foot Right AP and Lateral   REASON FOR EXAM: Cellulitis  COMMENTS:    PROCEDURE: DXR FOOT RIGHT AP AND LATERAL 01/16/2013 2:17 AM    RESULT:     Findings: A penetrating ulcer is identified along the plantar aspect of   foot. There is diffuse soft tissue swelling appreciated. No further foci   of air are identified within the soft tissue structures other than the   area of  what appears be a penetrating ulcer. The osseous structures   appear intact. Severe degenerative change is appreciated involving the   metatarsal tarsal articulations.  There does not appear to be evidence of   cortical irregularity beyond the areas of osteoarthritic change.  IMPRESSION:      1. Severe osteoarthritic changes.  2. No radiographic evidence of osteomyelitis though if there is clinical   concern further evaluation with MRI is more sensitive for the evaluation   of osteomyelitis.  3. There are findings consistent with cellulitis.  4. Penetrating ulcer is also identified within the plantar aspect of the   foot.    Thank you for the opportunity to contribute to the care of your patient.         Verified By: Mikki Santee, M.D., MD    16-Apr-14 02:36, Tibia And Fibula Right  Tibia And Fibula Right   REASON FOR EXAM:    swelling/erythema of leg  COMMENTS:       PROCEDURE: DXR - DXR TIBIA AND FIBULA RT (LOWER L  - Jan 16 2013  2:36AM     RESULT: There is no evidence of fracture, dislocation, or malalignment.    IMPRESSION:     1. No evidence of acute abnormalities.   2. If there are persistent complaints of pain or persistent clinical   concern, a repeat evaluation in 7-10 days is recommended  if clinically   warranted.     Thank you for the opportunity to contribute to the care of your patient.    Verified By: Mikki Santee, M.D., MD  [place]:    16-Apr-14 01:43, [place] Color Flow Doppler Lower Extrem Right (Leg)  [place] Color Flow Doppler Lower Extrem Right (Leg)   REASON FOR EXAM:    swelling of calf  COMMENTS:       PROCEDURE: [place]  - US DOPPLER LOW EXTR RIGHT  - Jan 16 2013  1:43AM     RESULT: Doppler interrogation of the deep venous system of the right leg   is performed from the common femoral vein through the popliteal vein. The   deep venous structures appear to be fully compressible with a normal   color Doppler and spectral Doppler appearance. There is no  abnormal fluid   collection evident. Enlarged lymph nodes are noted in the right groin.    IMPRESSION:   1. No evidence of right lower extremity deep vein thrombosis. Right groin   adenopathy present.  Dictation Site: 1        Verified By: Sundra Aland, M.D., MD    16-Apr-14 14:55, US Kidney Bilateral  US Kidney Bilateral   REASON FOR EXAM:    acute renal failure  COMMENTS:       PROCEDURE: [place]  - US KIDNEY  - Jan 16 2013  2:55PM     RESULT:     Findings: The right kidney measures 13.78 x 6.56 x 5.77 cm and the left   13.23 x 5.77 x 5.11 cm. There is appropriate corticomedullary   differentiation without evidence of hydronephrosis, solid or cystic   masses, nor calculi. The urinary bladder is decompressed. A Foley   catheter has been placed.    IMPRESSION:  Unremarkable bilateral renal ultrasound.  Thank you for the opportunity to contribute to the care of your patient.         Verified By: Mikki Santee, M.D., MD  Cardiology:    16-Apr-14 01:19, ECG  Ventricular Rate 115  Atrial Rate 115  P-R Interval 160  QRS Duration 76  QT 330  QTc 456  P Axis 62  R Axis 51  T Axis 105  ECG interpretation   Sinus tachycardia  Otherwise normal ECG  No previous ECGs available  ----------unconfirmed----------  Confirmed by OVERREAD, NOT ([id number]), editor PEARSON, BARBARA ([id number]) on 01/17/2013 2:23:41 PM  ECG     16-Apr-14 06:19, ECG  Ventricular Rate 149  Atrial Rate 149  P-R Interval 186  QRS Duration 76  QT 264  QTc 415  P Axis 64  R Axis 51  T Axis -150  ECG interpretation   Supraventricular tachycardia    Possible Left atrial enlargement  Septal infarct , age undetermined  Marked ST abnormality, possible inferior subendocardial injury  Abnormal ECG  No previous ECGs available  Confirmed by Humphrey Rolls, SHAUKAT ([id number]) on 01/16/2013 1:44:46 PM    Overreader: Neoma Laming  ECG     20-Apr-14 13:13, Echo Doppler  Echo Doppler   REASON FOR EXAM:      COMMENTS:        PROCEDURE: [place] - ECHO DOPPLER COMPLETE(TRANSTHOR)  - Jan 20 2013  1:13PM     RESULT: Echocardiogram Report    Patient Name:   [name] Date of Exam: 01/20/2013  Medical Rec #:  [id number]         Custom1:  Date of Birth:  05/18/1966  Height:       64.0 in  Patient Age:    27 years       Weight:       220.0 lb  Patient Gender: U              BSA:          2.04 m??    Indications: Angina  Sonographer:    JERRY HEGE RDCS  Referring Phys: Phillips Climes, S    Sonographer Comments: Technically very difficult study due to very poor   echo windows. Pt could not be positioned on left side due to right leg   injury. History of CABG    Summary:   1. Left ventricular ejection fraction, by visual estimation, is50 to   55%.   2. Normal global left ventricular systolic function.   3. Normal left ventricular size and wall thicknesses, with normal   systolic and diastolic function.   4. Normal right ventricular size and systolic function.   5. Normal RVSP.  2DAND M-MODE MEASUREMENTS (normal ranges within parentheses):  Left Ventricle:          Normal  IVSd (2D):      0.81 cm (0.7-1.1)  LVPWd (2D):     1.06 cm (0.7-1.1) Aorta/LA:                  Normal  LVIDd (2D):     4.46 cm (3.4-5.7) Aortic Root (2D): 2.80 cm (2.4-3.7)  LVIDs (2D):     3.55 cm           Left Atrium (2D): 3.10 cm (1.9-4.0)  LV FS (2D):     20.4 %   (>25%)  LV EF (2D):     41.9 %   (>50%)                                    Right Ventricle:                                    RVd (2D):  LV DIASTOLIC FUNCTION:  MV Peak E: 1.56 m/s E/e' Ratio: 20.50  MV Peak A: 1.01 m/s Decel Time: 190 msec  E/A Ratio: 1.54  SPECTRAL DOPPLER ANALYSIS (where applicable):  Mitral Valve:  MV P1/2 Time: 55.10 msec  MV Area, PHT: 3.99 cm??  Aortic Valve: AoV Max Vel: 1.24 m/s AoV Peak PG: 6.2 mmHg AoV Mean PG:  LVOT Vmax: 0.94 m/s LVOT VTI:  LVOT Diameter: 2.00 cm  AoV Area, Vmax: 2.38 cm?? AoV Area, VTI:  AoV Area,  Vmn:  Tricuspid Valve and PA/RV Systolic Pressure: TR Max Velocity: 1.92 m/s RA   Pressure: 5 mmHg RVSP/PASP: 19.7 mmHg    PHYSICIAN INTERPRETATION:  Left Ventricle: Normal left ventricular size and wall thicknesses, with   normal systolic and diastolic function. The left ventricular internal   cavity size was normal. LV posterior wall thickness was normal. No left   ventricular hypertrophy. Global LV systolic function was normal. Left   ventricular ejection fraction, by visual estimation, is 50 to 55%.   Spectral Doppler shows normal pattern of LV diastolic filling.  Right Ventricle: Normal right ventricular size, wall thickness, and   systolic function. The right ventricular size is normal. Global RV   systolic function is normal.  Left Atrium: The left atrium is normal  in size and structure. The left   atrium is normal in size.  Right Atrium: The right atrium is normal in size.  Pericardium: There is no evidence of pericardial effusion.  Mitral Valve: Structurally normal mitral valve, with normal leaflet   excursion; without any evidence of mitral stenosis or significant   regurgitation. The mitral valve is normal in structure. Trace mitral   valve regurgitation is seen.  Tricuspid Valve: Structurally normal tricuspid valve, with normal leaflet   excursion. The tricuspid valve is normal. Trivial tricuspid regurgitation  is visualized. The tricuspid regurgitant velocity is 1.92 m/s, and with   an assumed right atrial pressure of 5 mmHg, the estimated right     ventricular systolic pressure is normal at 19.7 mmHg.  Aortic Valve: The aortic valve was not well seen. The aortic valve is   structurally normal, with no evidence of sclerosis or stenosis. No   evidence of aortic valve regurgitation is seen.  Pulmonic Valve: Structurally normal pulmonic valve, with normal leaflet   excursion. The pulmonic valve is normal.  Aorta: The aortic root is normal in size and structure.    [id number]  Ida Rogue MD  Electronically signed by [id number] Ida Rogue MD  Signature Date/Time: 01/20/2013/8:16:10 PM    *** Final ***    IMPRESSION: .    Verified By: Minna Merritts, M.D., MD    No Known Allergies:   Vital Signs/Nurse's Notes: **Vital Signs.:   21-Apr-14 01:32  Vital Signs Type Q 4hr  Temperature Temperature (F) 97.8  Celsius 36.5  Temperature Source oral  Pulse Pulse 81  Respirations Respirations 20  Systolic BP Systolic BP 151  Diastolic BP (mmHg) Diastolic BP (mmHg) 70  Mean BP 84  Pulse Ox % Pulse Ox % 96  Pulse Ox Activity Level  At rest  Oxygen Delivery Room Air/ 21 %    06:51  Vital Signs Type Q 4hr  Temperature Temperature (F) 98  Celsius 36.6  Temperature Source oral  Pulse Pulse 88  Respirations Respirations 20  Systolic BP Systolic BP 761  Diastolic BP (mmHg) Diastolic BP (mmHg) 87  Mean BP 105  Pulse Ox % Pulse Ox % 96  Pulse Ox Activity Level  At rest  Oxygen Delivery Room Air/ 21 %    Impression 49yo w/ PMHx s/f CAD, PVD, DM2 w/ diabetic neuropathy, HLD, asthma and depression admitted to [facility] 01/16/13 for sepsis 2/2 RLE cellulitis, abscess and necrosis.  1. Sepsis Resolved. Sourced from #2. Continued on BS abx. Remains tachycardic due to pain, infection.   2. PVD- RLE cellulitis, abscess and necrosis H/o of L BKA in 2013. Plan is for angiogram per vascular to assess level and severity of PVD to RLE. Renal function improved.       - Defer timing of this to vascular team.       - Ok to proceed with angiogram from cardiac standpoint.   3. Acute renal failure Resolved with IVF hydration. Secondary to renal underperfusion from #1/2.   4. CAD s/p CABG Episode of chest pain tightness yesterday, 5/10, c/w prior anginal episodes, nitro-responsive. She endorses intermittent angina at home at rest. This has been stable in frequency and severity. She has known severe, diabetic CAD. No ischemic testing since her bypass grafting. Unclear  significance of troponemia given recent sepsis, acute renal failure, normal CK-MB x 2 yesterday and unchanged EKG from admission.      - As above, ok from a cardiac standpoint to undergo angiogram. Will  need ischemic eval in the form of Lexiscan Myoview prior to any vascular intervention.       - Continue low-dose ASA, current BB regimen, statin, NTG SL PRN      - Resume long-acting nitrate at discharge- on Imdur 60 at home      - BP better-controlled today. If remains elevated, add PRN hydralazine.       - Start ACEi/ARB at discharge if renal function stable with underlying DM  5. DM w/ diabetic neuropathy Hyperglycemic on admission. A1C 9.3%. CBGs much improved on SSI + Lantus initiated by primary team.   6. HLD      - Continue statin   Electronic Signatures for Addendum Section:  Kathlyn Sacramento (MD) (Signed Addendum 21-Apr-14 18:16)  The patient was seen and examined. Extensive atherosclerosis with known history of CAD s/p CABG with previous amputation and prolonged history of diabets. Now with ischemic right leg. Reports overall atypical chest pain. ECG with ST depression in setting of tachycardia.  I think she is overall low risk to proceed with angiogram and endovascular intervention. However, if she requires any surgery, she will require a pharmocologic nuclear stress test for risk stratification. A stress test is indicated regradless due to chest pain.   Electronic Signatures: Derral Colucci A (PA-C)  (Signed 22-Apr-14 08:01)  Authored: General Aspect/Present Illness, History and Physical Exam, Review of System, Past Medical History, Health Issues, Orders, Home Medications, Labs, EKG , Radiology, Allergies, Vital Signs/Nurse's Notes, Impression/Plan Kathlyn Sacramento (MD)  (Signed 21-Apr-14 18:16)  Co-Signer: General Aspect/Present Illness, Home Medications, Labs, EKG , Radiology, Allergies, Vital Signs/Nurse's Notes, Impression/Plan   Last Updated: 22-Apr-14 08:01 by Meriel Pica (PA-C)

## 2015-01-23 NOTE — Op Note (Signed)
PATIENT NAME:  Starrett, Earlean MR#:  [id number] DATE OF BIRTH:  05/14/1966  DATE OF PROCEDURE:  04/04/2013  PREOPERATIVE DIAGNOSIS:  Ulceration, right lower extremity.   POSTOPERATIVE DIAGNOSIS:  Ulceration, right lower extremity  OPERATION:  Wound debridement and wound VAC placement.   ANESTHESIA:  Monitored anesthetic care.   SURGEON:  Bessie Livingood L. Ely, MD  OPERATIVE PROCEDURE:  With the patient in the supine position after induction of appropriate intravenous sedation, the patient's right leg was prepped with Betadine and draped with sterile towels. The necrotic eschar was removed sharply. The debridement was taken down to the muscle layer. Scissors and Kelly clamps were utilized. The debridement was excisional. Following the debridement, a wound VAC was placed without difficulty and appropriate suction achieved. The patient was returned to the Recovery Room having tolerated the procedure well.   ____________________________ Channin Agustin L. Ely III, MD rle:jm D: 04/04/2013 14:03:54 ET T: 04/04/2013 17:07:46 ET JOB#: [id number]  cc: Mckaylee Dimalanta L. Ely III, MD, <Dictator> Zelena Bushong L ELY MD ELECTRONICALLY SIGNED 04/05/2013 8:26 

## 2015-01-23 NOTE — Discharge Summary (Signed)
PATIENT NAME:  [name], [name] MR#:  [id number] DATE OF BIRTH:  [birth year]  DATE OF ADMISSION:  01/16/2013 DATE OF DISCHARGE:  01/25/2013  ADMITTING DIAGNOSIS:   Right leg cellulitis.  DISCHARGE DIAGNOSES:   1.  Right leg cellulitis with peripheral vascular disease status post extensive revascularization and debridement with wound Vacuum-Assisted Closure. 2.  Acute renal failure, resolved. 3.  Chest pain, likely angina.   4.  Coronary artery disease.  4.  Hyperlipidemia.  5.  Asthma. 6.  Status post left below-the-knee amputation. 7.  Anemia, acute and chronic. 8.  Diabetes.  CONSULTS:   1.  Dr. Leavy Cella. 2.  Dr. Wyn Quaker. 3.  Dr. Thedore Mins. 4.  Dr. Alberteen Spindle.  LABORATORY DATA AT DISCHARGE:  White blood cells 11, hemoglobin 7.7, hematocrit 24, platelets 513.   MRI of the foot showed no evidence of osteomyelitis.  It did show extensive cellulitis ( PROCEDURES:   1.   01/18/2013:  Incision and drainage with additional debridement.  2.  01/23/2013:  Percutaneous transluminal angioplasty of right SFA stenosis/occlusions; percutaneous transluminal angioplasty of right popliteal stenosis with 4 and 5 mm diameter angioplasty balloon;  percutaneous transluminal angioplasty of tibial peroneal trunk; percutaneous transluminal angioplasty of multiple stenoses in the right posterior tibial artery.    HOSPITAL COURSE:  The patient is a 49 year old female with a history of diabetes and left BKA, who presented with right leg infection.  For further details, please refer to the history and physical.  1.  Right leg infection.  It is unclear if the patient had osteomyelitis or just a cellulitis. She came in with acute renal failure, so we are unable to do imaging and contrast initially due to her acute renal failure. She was placed on broad-spectrum antibiotics including vancomycin and Zosyn. She underwent debridement with wound VAC placement of the right lower extremity on 01/18/2013 under Dr. Gilda Crease.  Those  cultures are growing GROUP A Streptococcus.   Dr. Leavy Cella was heavily involved in the patient's care and recommended continuing the antibiotic.  Since we were unable to identify that the patient had osteomyelitis clinically, we obtained an MRI of the leg that showed no evidence of osteomyelitis. She was switched to Augmentin p.o. for 2 weeks and she will have close followup with Dr. Alberteen Spindle and Dr. Leavy Cella as well as Dr. Gilda Crease.  She currently has a wound VAC in place and will be getting home health nurse to help her with her wound VAC at home.  She had an angiogram on 01/23/2013 with extensive revascularization by Dr. Wyn Quaker.   2. Acute renal failure secondary to hypotension, improved with IV fluids. We held all nephrotoxic agents. She actually received premedication prior to the revascularization and angiogram. Her creatinine remained normal and stable.  3.  Hyponatremia from volume depletion, which has improved.  4.  Diabetes.  Her A1c was greater than 9. She was switched to Lantus 20 units and blood sugars were better controlled on this regimen along with her sliding scale insulin.  She will need close outpatient followup.   5.  Sinus tachycardia, likely from pain and volume depletion.  As her hospitalization progressed, her heart rate was normalized.  6.  Hyperlipidemia. The patient is on a statin.  7.  Asthma. The patient was stable.  8.  Left BKA.  The patient will continue with wet-to-dry dressing.  Her wound looks like it is healing.   9.  Chest pain. The patient had one episode of chest pain while in the hospital. Troponins were  negative.  It is likely angina.  Dr. Kirke CorinArida was consulted. He also speculated this is likely due to angina versus demand ischemia from her sepsis, acute renal failure and infection. She will need outpatient followup and prior to any surgeries will need a cardiac work-up including stress test.  10.  Anemia, acute on chronic, secondary to wound VAC.  Her hemoglobin,  although low at 7.8 at discharge, has been able.   DISCHARGE MEDICATIONS: 1.   Docusate 100 mg t.i.d.  2.  Tylenol 325, 2 tablets 4 times a day.  3.  Nitrostat sublingual 0.4 mg p.r.n. chest pain.  4.  Oxycodone 5 mg q. 4 hours p.r.n. pain.  5.  Trazodone 50 mg at bedtime.  6.  Novolin-N 17 units b.i.d. before breakfast and supper.  7.  Silver sulfadine topical applied to affected area daily. 8.  Aspirin 81 mg daily.  9.  Imdur 60 mg daily.  10.  Ferrous sulfate 325 mg daily.  11.  Combivent 2 puffs q. 6 hours p.r.n.  12.  Famotidine 20 mg b.i.d.  13.  Metoprolol 50 mg b.i.d.  14.  Gabapentin 300 mg t.i.d.  15.  Cymbalta 60 mg daily.  16.  Plavix 75 mg daily.  17.  Atorvastatin 40 mg at bedtime.  18.  Novolin R sliding scale before meals and sleep.  19.  Lantus 20 units at bedtime.  20.  Augmentin 875/25, 1 tablet q.12 hours x 14 days.   HOME HEALTH: Wound VAC and nurse change wound VAC every 3 days.   DRESSINGS:  Keep dressings dry with wet-to-dry daily for her left below-knee amputation.   DISCHARGE DIET: Regular ADA diet.   DISCHARGE ACTIVITY: As tolerated.   DISCHARGE REFERRAL:  Home health.   DISCHARGE FOLLOW UP:  The patient to follow up with Dr. Festus BarrenJason Dew in 2 weeks, with Dr. Orson AloeMichael Blocker in 1 week, Dr. Alberteen Spindleline in 7 to 10 days and Dr. Kirke CorinArida in 7 to 10 days.   TIME SPENT:  Approximately 40 minutes.   ____________________________ Janyth ContesSital P. Juliene PinaMody, MD spm:ct D: 01/25/2013 19:45:57 ET T: 01/26/2013 08:24:49 ET JOB#: [id number]  cc: Robynne Roat P. Juliene PinaMody, MD, <Dictator> Annice NeedyJason S. Dew, MD Linus Galasodd Cline, DPM Rosalyn GessMichael E. Blocker, MD Jerolyn CenterMuhammad A. Kirke CorinArida, MD  Janyth ContesSITAL P Armonie Mettler MD ELECTRONICALLY SIGNED 02/02/2013 13:24
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
## 2015-01-23 NOTE — Discharge Summary (Signed)
PATIENT NAME:  [name], [name] MR#:  [id number] DATE OF BIRTH:  [birth year]  DATE OF ADMISSION:  04/01/2013 DATE OF DISCHARGE:  04/05/2013  CONSULTANTS:  Dr. Julien Nordmann from Cardiology. Dr. Michela Pitcher from Surgery.   PRIMARY CARE PHYSICIAN:  Dr. Nira Conn.   DISCHARGE DIAGNOSES:  1.  Cellulitis of right lower extremity with large wound status post debridement and wound VAC placement by Surgery.  2.  Significant peripheral vascular disease with extensive revascularization in the past.  3.  Hyponatremia.  4.  Chronic compensated diastolic congestive heart failure.  5.  Hypertension.  6.  Diabetes.  7..  A history of tobacco abuse.  8.  A history of right below-knee amputation.  9.  A history of non-ST elevation myocardial infarction.  10.  A history of coronary artery disease status post coronary artery bypass graft.  11.  A history of right lower extremity wounds.  12.  A history of recent anemia.  13.  Depression.  14.  Asthma.  15.  Hyperlipidemia.   DISCHARGE MEDICATIONS:  Aspirin 81 mg daily, Tylenol 650 mg every 4 hours as needed for pain or fever, oxycodone 5 mg every 6 hours as needed for pain, Nitrostat 0.4 mg sublingual 1 every 5 minutes as needed for chest pain, gabapentin 300 mg 3 times a day, Plavix 75 mg daily, famotidine 20 mg 2 times a day, ferrous sulfate 325 mg daily, Lasix 40 mg daily, isosorbide mononitrate 60 mg extended-release 1 tab once a day in the morning, metoprolol tartrate 50 mg 2 times a day, trazodone 50 mg once a day, atorvastatin 80 mg at bedtime, Lantus 20 units in the morning, Cipro 500 mg every 12 hours, Cymbalta 60 mg once a day, Adoxa monohydrate 100 mg 1 tab 2 times a day.   She will be going home with Home Health, PT and R.N.   DIET:  Low-sodium, low-fat, low-cholesterol, ADA diet.   ACTIVITY:  As tolerated.   FOLLOWUP:  Please follow with PCP within 1 to 2 weeks. Please follow with Dr. Michela Pitcher within a week as well as with [facility] within  a week. If the wound gets red, has drainage or you have fevers or any other issues, call your PCP right away.  HISTORY OF PRESENT ILLNESS AND HOSPITAL COURSE:  For full details of H and P, please see the dictation by Dr. Winona Legato on June 30th, but briefly this is a 49 year old with extensive medical issues including a history of severe peripheral vascular disease status post left BKA who came in with the right extremity swelling, pain and fevers and chills. She also had some redness in right lower extremity, was admitted to the hospitalist service for further evaluation and management. She was seen by Surgery as well as Dr. Michela Pitcher. The patient was suppose to have a wound VAC but had refused it at the last admission. She was seen by Dr. Michela Pitcher from Surgery and was recommended to have debridement and a wound VAC placed. Given the recent MI, Cardiology was consulted and she was seen by Dr. Mariah Milling. She underwent a wound debridement and a wound VAC was placed for the right lower extremity cellulitis and ulcer. She did well and was on vancomycin and Zosyn while she was hospitalized. Her antibiotics were changed to Cipro and doxycycline to cover for possible MRSA and gram negatives. Initially, we are going to discharge her on Zyvox; however, the patient is on Cymbalta, which could have a serotonin syndrome with Zyvox, and therefore  it was changed to doxycycline. She is to follow up with Dr. Michela PitcherEly as an outpatient. She had mild hyponatremia which corrected with fluids. She had compensated diastolic CHF and was not in any overt CHF. Her Lasix was held while she was on fluids. She may resume her Lasix as an outpatient.   TOTAL TIME SPENT:  35 minutes.   ____________________________ Krystal EatonShayiq Fynn Adel, MD sa:jm D: 04/06/2013 08:08:00 ET T: 04/06/2013 10:34:42 ET JOB#: [id number]  cc: Krystal EatonShayiq Kathee Tumlin, MD, <Dictator> Nira ConnAriana Pancaldo, MD Krystal EatonSHAYIQ Henri Guedes MD ELECTRONICALLY SIGNED 05/04/2013 11:36

## 2015-01-23 NOTE — H&P (Signed)
PATIENT NAME:  [name], [name] MR#:  [id number] DATE OF BIRTH:  1965-12-19  DATE OF ADMISSION:  01/16/2013  REFERRING PHYSICIAN: Dr. Lavonia Drafts.    PRIMARY CARE PHYSICIAN: The patient used to have all of her care done at [facility] but recently moved to [place] and has no followup established here.    HISTORY OF PRESENT ILLNESS: This is a 49 year old female with significant past medical history of coronary artery disease, diabetes mellitus, neuropathy, depression, hyperlipidemia, asthma, who presents with complaints of right lower extremity erythema, pain, tenderness and new ulceration on the right foot. The patient reports she has been at a rest home, where she lived before 2 weeks where she is currently living at her boyfriend's apartment, where she says she is out of her meds for the last week. She reports over the last few days she started to develop these symptoms in her right lower extremity. She reports it became extremely tender and painful which prompted her to come to the ED. As well, the patient was noticed to have multiple ulcerations on the right lower extremity. She reports the one in the medial middle side has been there for the last 3 years. As well, she reports there is a new one developing in the great toe area with erythema and ulceration on the bottom. This has been for the last 2 days. In the ED, the patient had negative venous Doppler for DVT in the right lower extremity, and the patient reports she had a recent left below-knee amputation in the left lower extremity secondary to clinical pain on 09/09/2012 at [facility], where she was discharged to rest home due to wound care. The patient was found to be tachycardic, hypotensive and with  white blood cell count of 32,000. The patient's blood pressure improved with IV fluids. The patient was afebrile. The patient was noticed to be in acute renal failure with a creatinine of 3.32. She denies any history of kidney disease. Unclear what is her baseline  as there are no previous labs we have for her before. As well, she had uncontrolled blood sugar with blood glucose of 416. As mentioned earlier, she reports she was show out of her meds for the last week, but the anion gap was 8 so she was not in DKA. Her white count was 32,000. She denied any cough, any productive sputum, any dysuria, polyuria. As well, she was afebrile. Denies any fever or chills at home.   PAST MEDICAL HISTORY:  1. Coronary artery disease.  2. Diabetes mellitus.  3. Diabetic neuropathy.  4. Depression.  5. Hyperlipidemia.  6. Asthma.   PAST SURGICAL HISTORY:  1. 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  2. CABG in February 2013.   Both of these surgeries were done at [facility].   FAMILY HISTORY: Significant for diabetes mellitus.   SOCIAL HISTORY: Currently lives with her boyfriend. Quit smoking for a year and a half. No history of alcohol or illicit drug use.   ALLERGIES: No known drug allergies.   HOME MEDICATIONS: As mentioned earlier, the patient reports she has been off her meds for the last week, but she is on:  1. Aspirin 81 mg oral daily.  2. Tylenol 325 two tablets every 6 hours as needed.  3. Oxycodone 5 mg every 4 hours as needed for pain.  4. Isosorbide mononitrate 60 mg oral daily.  5. Nitrostat 0.4 mg sublingual every 5 minutes as needed.  6. Gabapentin 300 mg oral 3 times a day.  7. Cymbalta  60 mg oral daily.  8. Trazodone 50 mg oral at bedtime.  9. Metformin 1000 mg oral 2 times a day.  10. Novolin N subcutaneous 17 units 2 times a day before breakfast and supper.  11. Promethazine 25 mg oral every 6 hours as needed.  12. Atorvastatin 50 mg oral daily.  13. Doxycycline 100 mg oral 2 times a day.  14. Plavix 75 mg oral daily.  15. Metoprolol 50 mg oral 2 times a day.  16. Combivent inhalation 2 puffs every 6 hours as needed.  17. Silvadene topical, apply topically once a day.  18. Lasix 20 mg oral daily.  19. Famotidine 20 mg oral 2 times a day.  20. Ferrous  sulfate 325 mg oral daily.  21. Bisacodyl as needed.  22. Docusate as needed.  22. Novolin R sliding scale.   REVIEW OF SYSTEMS:  CONSTITUTIONAL: The patient denies any fever, chills, fatigue, weight gain, weight loss.  EYES: Denies blurry vision, double vision, pain, inflammation.  ENT: Denies tinnitus, ear pain, hearing loss, epistaxis.  RESPIRATORY: Denies cough, wheezing, hemoptysis, COPD.   CARDIOVASCULAR: Denies chest pain, arrhythmia, palpitations, syncope.  GASTROINTESTINAL: Denies nausea, vomiting, diarrhea, abdominal pain, hematemesis, rectal bleed, constipation or hemorrhoids.  GENITOURINARY: Denies dysuria, hematuria, renal colic.  ENDOCRINE: Denies polyuria, polydipsia, heat or cold intolerance.  HEMATOLOGY: Denies any easy bruising, bleeding diathesis.  INTEGUMENTARY: Denies acne. Has erythema in the right lower extremity with ulceration in the right foot.  MUSCULOSKELETAL: Complains of right lower extremity pain and tenderness. Has left below-knee amputation in the left lower extremity.  NEUROLOGICAL: Denies any CVA, TIA, seizures, memory loss, headache, vertigo.  PSYCHIATRIC: Denies substance or alcohol abuse, schizophrenia, anxiety or insomnia. Has a history of depression.   PHYSICAL EXAM:  VITAL SIGNS: Temperature 98.8, pulse 112, respiratory rate 16, blood pressure 99/64, saturating 96% on room air.  GENERAL: Obese female who looks comfortable, in no apparent distress.  HEENT: Head atraumatic normocephalic. Pupils equal, reactive to light. Pink conjunctivae. Anicteric sclerae. Moist oral mucosa.  NECK: Supple. No thyromegaly. No JVD.  CHEST: Good air entry bilaterally. No wheezing, rales, rhonchi.  CARDIOVASCULAR: S1, S2 heard. No rubs, murmurs or gallops. Chest has surgical scar midline due to previous CABG.  ABDOMEN: Soft, nontender, nondistended. Bowel sounds present.  EXTREMITIES: Has left below-knee amputation where the stump has dehiscence on the lateral side.  Right lower extremity: The patient has Charcot right foot with multiple ulcerations, most significant in the great toe with ulceration of the inferior surface that appears to be wet gangrene with medial midline ulceration as well. Has swelling and has significant right lower extremity tenderness with erythema as well as knee dried ulcerations which appear to be more like wounds than ulceration.  NEUROLOGIC: Cranial nerves grossly intact. Motor appears to be 5 out of 5. No focal deficits.  PSYCHIATRIC: Appropriate affect. Awake, alert x3. Intact judgment and insight.  LYMPHATICS: I could not appreciate any lymph node enlargement in the cervical or axillary area.   PERTINENT LABS: Glucose 398, BUN 34, creatinine 3.32, sodium 123, potassium 4.1, chloride 89, CO2 26. Total protein 7.8, albumin 2.2, total bili 1.1, alk phos 185, AST 34, ALT 22. White blood cells 32.4, hemoglobin 11.9, hematocrit 35.4, platelets 307.   EKG showing sinus tachycardia without significant ST or T wave changes.   Venous Doppler showing no evidence of right lower extremity DVT.    ASSESSMENT AND PLAN:  1. Sepsis: The patient presents with sepsis as she is upon initial  presentation hypotensive, tachycardic, with leukocytosis. This is most likely related to her right lower extremity infectious process. Will proceed with sending the rest of septic workup. Will follow on the blood cultures and urinalysis.  2. Right lower extremity pain and tenderness: The patient appears to be having Charcot foot with multiple right lower extremity ulcerations. Appears to be due to diabetic ulceration, as well as due to ischemia with poor circulation and with gangrene mainly in the right great toe. The patient will be started on broad-spectrum antibiotics, intravenous vancomycin and Zosyn. Will follow on the blood cultures. Will consult vascular surgery to evaluate for ischemia and if possible intervention needed, as well as consult podiatry service,  as well as consult surgical service to evaluate for the right lower extremity ulcerations, mainly giving her significant tenderness in the right lower extremity. The patient had negative right lower extremity venous Doppler for deep vein thrombosis.  3. Acute renal failure: Unclear what is the patient's baseline creatinine, but she denies any history of renal disease. Will insert Foley catheter. Will hold her metformin. Will continue with aggressive fluid hydration and will consult renal service.  4. Hyponatremia: This is most likely due to volume depletion. Will hold Lasix. Continue with intravenous fluids.  5. Diabetes mellitus, uncontrolled: Will start the patient on insulin sliding scale and will start her on Lantus as well.  6. Coronary artery disease: No complaints of chest pain. No complaints of shortness of breath. Will resume the patient on aspirin. Will hold on resuming Plavix as she might need some surgical intervention. As well, will continue her on statin. Will be holding her blood pressure meds as blood pressure is on the lower side.  7. Diabetic neuropathy: Will continue with gabapentin when her blood pressure improves.  8. Depression: Continue with Cymbalta.  9. Hyperlipidemia: Continue with statin.  10. Asthma: Does not have any wheezing or shortness of breath. Will have her on p.r.n. DuoNebs.  10. Left below-knee amputation with wound dehiscence: Will consult wound care service.  11. Deep vein thrombosis prophylaxis: Subcutaneous heparin.  12. Gastrointestinal prophylaxis: On famotidine.   CODE STATUS: FULL CODE.   TOTAL TIME SPENT ON ADMISSION AND PATIENT CARE: 60 minutes.    ____________________________ Albertine Patricia, MD dse:gb D: 01/16/2013 03:41:20 ET T: 01/16/2013 04:23:01 ET JOB#: [id number]  cc: Albertine Patricia, MD, <Dictator> Corie Allis Graciela Husbands MD ELECTRONICALLY SIGNED 01/24/2013 0:38

## 2015-01-23 NOTE — Op Note (Signed)
PATIENT NAME:  [name], [name] MR#:  [id number] DATE OF BIRTH:  [birth year]  DATE OF PROCEDURE:  04/04/2013  PREOPERATIVE DIAGNOSIS:  Ulceration, right lower extremity.   POSTOPERATIVE DIAGNOSIS:  Ulceration, right lower extremity  OPERATION:  Wound debridement and wound VAC placement.   ANESTHESIA:  Monitored anesthetic care.   SURGEON:  Quentin Orealph L. Ely, MD  OPERATIVE PROCEDURE:  With the patient in the supine position after induction of appropriate intravenous sedation, the patient's right leg was prepped with Betadine and draped with sterile towels. The necrotic eschar was removed sharply. The debridement was taken down to the muscle layer. Scissors and Kelly clamps were utilized. The debridement was excisional. Following the debridement, a wound VAC was placed without difficulty and appropriate suction achieved. The patient was returned to the Recovery Room having tolerated the procedure well.   ____________________________ Carmie Endalph L. Ely III, MD rle:jm D: 04/04/2013 14:03:54 ET T: 04/04/2013 17:07:46 ET JOB#: [id number]  cc: Quentin Orealph L. Ely III, MD, <Dictator> Quentin OreALPH L ELY MD ELECTRONICALLY SIGNED 04/05/2013 8:26

## 2015-01-23 NOTE — Consult Note (Signed)
General Aspect right leg cellulitis associated with ASO and ulceration andacute renal failure   Present Illness The patient is a 49 year old female with significant past medical history of coronary artery disease, diabetes mellitus, neuropathy, depression, hyperlipidemia, asthma, who presents with complaints of right lower extremity erythema, pain, tenderness and new ulceration on the right foot. She reports over the last few days she started to develop symptoms of pain swelling and redness in her right lower extremity. She reports it became extremely tender and painful which prompted her to come to the ED. The patient has multiple ulcerations on the right lower extremity. She reports the one in the medial middle side has been there for the last 3 years. As well, she reports there is a new one developing in the great toe area with erythema and ulceration on the bottom. This has been for the last 2 days. 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 The day of admission the patient was found to be tachycardic, hypotensive  with  white blood cell count of 32,000. The patient's blood pressure improved with IV fluids. The patient was afebrile. The patient was noticed to be in acute renal failure with a creatinine of 3.32. She denies any history of kidney disease.  PAST MEDICAL HISTORY:  1. Coronary artery disease.  2. Diabetes mellitus.  3. Diabetic neuropathy.  4. Depression.  5. Hyperlipidemia.  6. Asthma.   PAST SURGICAL HISTORY:  1. 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  2. CABG in February 2013.   Home Medications: Medication Instructions Status  Combivent Respimat CFC free 100 mcg-20 mcg/inh inhalation aerosol 2 puff(s) inhaled every 6 hours, As Needed - for Shortness of  Breath Active  doxycycline hyclate hyclate 100 mg oral tablet 1 tab(s) orally 2 times a day Active  famotidine 20 mg oral tablet 1 tab(s) orally 2 times a day Active  Metoprolol Tartrate 50 mg oral tablet 1 tab(s) orally 2 times a day Active  gabapentin 300 mg oral capsule 1 cap(s) orally 3 times a day Active  Cymbalta 60 mg oral delayed release capsule 1 cap(s) orally once a day Active  clopidogrel 75 mg oral tablet 1 tab(s) orally once a day Active  furosemide 20 mg oral tablet 0.5 tab(s) orally every other day  Active  atorvastatin 40 mg oral tablet 1 tab(s) orally once a day (at bedtime) Active  novolin r   sliding scale before meals and and hour of sleep Active  Thermazene 1% topical cream Apply topically to affected area 2 times a day Active  metFORMIN 1000 mg oral tablet 1 tab(s) orally 2 times a day Active  ferrous sulfate 325 mg oral tablet 1 tab(s) orally once a day Active  isosorbide mononitrate extended release 60 mg oral tablet, extended release 1 tab(s) orally once a day (in the morning) Active  Aspirin Enteric Coated 81 mg oral delayed release tablet 1 tab(s) orally once a day Active  silver sulfADIAZINE topical 1% topical cream Apply topically to affected area once a day Active  NovoLIN N human recombinant 100 units/mL subcutaneous suspension 17 unit(s) subcutaneous 2 times a day, before breakfast and supper Active  traZODone 50 mg oral tablet 1 tab(s) orally once (at bedtime) Active  promethazine 25 mg oral tablet  1 tab(s) orally every 6 hours, As Needed - for Nausea, Vomiting Active  bisacodyl 5 mg oral delayed release tablet 2 tab(s) orally once a day, As Needed - for Constipation Active  Robafen 100 mg/5 mL oral liquid 10 milliliter(s) orally every 6 hours, As Needed Active  oxyCODONE 5 mg oral tablet 1 tab(s) orally every 4 hours, As Needed - for Pain Active  Nitrostat 0.4 mg sublingual tablet 1 tab(s) sublingual every 5 minutes x 3, As Needed - for Chest Pain Active   Mapap 325 mg oral tablet 2 tab(s) orally 4 times a day Active  docusate sodium sodium 100 mg oral capsule 1 cap(s) orally 3 times a day Active    No Known Allergies:   Case History:  Family History Non-Contributory   Social History negative tobacco, negative ETOH, negative Illicit drugs   Review of Systems:  ROS Pt not able to provide ROS  lethargic, keeps falling asleep mid sentance   Physical Exam:  GEN well developed, well nourished, ill appearing   HEENT hearing intact to voice, dry oral mucosa   NECK supple  trachea midline   RESP normal resp effort  no use of accessory muscles   CARD regular rate  no JVD   ABD denies tenderness  nondistended   EXTR negative cyanosis/clubbing, positive edema, right leg with erythema and tenderness, multiple ulcers noted on the knee and the foot, pulses are nonpalpable, left leg with BKA   SKIN positive rashes, positive ulcers, skin turgor poor   PSYCH poor insight, lethargic   Nursing/Ancillary Notes: **Vital Signs.:   16-Apr-14 08:28  Vital Signs Type Routine  Temperature Temperature (F) 101.2  Celsius 38.4  Temperature Source oral  Pulse Pulse 140  Respirations Respirations 20  Systolic BP Systolic BP 606  Diastolic BP (mmHg) Diastolic BP (mmHg) 62  Mean BP 79  Pulse Ox % Pulse Ox % 90  Pulse Ox Activity Level  At rest  Oxygen Delivery Room Air/ 21 %   Hepatic:  16-Apr-14 01:01   Bilirubin, Total  1.1  Alkaline Phosphatase  185  SGPT (ALT) 22  SGOT (AST) 34  Total Protein, Serum 7.8  Albumin, Serum  2.2  Cardiology:  16-Apr-14 06:19   Ventricular Rate 149  Atrial Rate 149  P-R Interval 186  QRS Duration 76  QT 264  QTc 415  P Axis 64  R Axis 51  T Axis -150  ECG interpretation Supraventricular tachycardia  Possible Left atrial enlargement Septal infarct , age undetermined Marked ST abnormality, possible inferior subendocardial injury Abnormal ECG No previous ECGs available Confirmed by Humphrey Rolls,  SHAUKAT ([id number]) on 01/16/2013 1:44:46 PM  Overreader: Neoma Laming  Routine Chem:  16-Apr-14 01:01   Glucose, Serum  398  BUN  34  Creatinine (comp)  3.32  Sodium, Serum  123  Potassium, Serum 4.1  Chloride, Serum  89  CO2, Serum 26  Calcium (Total), Serum  8.0  Anion Gap 8  Osmolality (calc) 272  eGFR (African American)  18  eGFR (Non-African American)  15 (eGFR values <23m/min/1.73 m2 may be an indication of chronic kidney disease (CKD). Calculated eGFR is useful in patients with stable renal function. The eGFR calculation will not be reliable in acutely ill patients when serum creatinine is changing rapidly. It is not useful in  patients on dialysis. The eGFR calculation may not be applicable to patients at the low and high extremes of body sizes, pregnant women, and vegetarians.)  Result Comment POTASSIUM/AST - Slight  hemolysis, interpret results with  - caution...tpl  Result(s) reported on 16 Jan 2013 at 02:00AM.    11:28   Hemoglobin A1c ([facility])  9.3 (The American Diabetes Association recommends that a primary goal of therapy should be <7% and that physicians should reevaluate the treatment regimen in patients with HbA1c values consistently >8%.)  Glucose, Serum  203  BUN  34  Creatinine (comp)  2.99  Sodium, Serum  126  Potassium, Serum 3.7  Chloride, Serum  97  CO2, Serum 23  Calcium (Total), Serum  7.2  Anion Gap  6  Osmolality (calc) 267  eGFR (African American)  20  eGFR (Non-African American)  18 (eGFR values <44m/min/1.73 m2 may be an indication of chronic kidney disease (CKD). Calculated eGFR is useful in patients with stable renal function. The eGFR calculation will not be reliable in acutely ill patients when serum creatinine is changing rapidly. It is not useful in  patients on dialysis. The eGFR calculation may not be applicable to patients at the low and high extremes of body sizes, pregnant women, and vegetarians.)    17:27   Ferritin ([place] 133  (Result(s) reported on 16 Jan 2013 at 06:08PM.)  Iron Binding Capacity (TIBC)  163  Unbound Iron Binding Capacity 149  Iron, Serum  14  Iron Saturation 9 (Result(s) reported on 16 Jan 2013 at 06:16PM.)  Routine UA:  16-Apr-14 03:40   Color (UA) Amber  Clarity (UA) Cloudy  Glucose (UA) 150 mg/dL  Bilirubin (UA) Negative  Ketones (UA) Negative  Specific Gravity (UA) 1.021  Blood (UA) Negative  pH (UA) 5.0  Protein (UA) 100 mg/dL  Nitrite (UA) Negative  Leukocyte Esterase (UA) Negative (Result(s) reported on 16 Jan 2013 at 04:33AM.)  RBC (UA) 6 /HPF  WBC (UA) 12 /HPF  Bacteria (UA) 1+  Epithelial Cells (UA) 3 /HPF  Mucous (UA) PRESENT  Hyaline Cast (UA) 19 /LPF  Amorphous Crystal (UA) PRESENT (Result(s) reported on 16 Jan 2013 at 04:33AM.)  Routine Hem:  16-Apr-14 01:01   WBC (CBC)  32.4  RBC (CBC) 3.94  Hemoglobin (CBC)  11.9  Hematocrit (CBC) 35.4  Platelet Count (CBC) 307  MCV 90  MCH 30.1  MCHC 33.5  RDW 14.3  Neutrophil % 91.1  Lymphocyte % 4.8  Monocyte % 3.5  Eosinophil % 0.1  Basophil % 0.5  Neutrophil #  29.5  Lymphocyte # 1.6  Monocyte #  1.1  Eosinophil # 0.0  Basophil #  0.2 (Result(s) reported on 16 Jan 2013 at 02:00AM.)   [place]    16-Apr-14 01:43, [place] Flow Doppler Lower Extrem Right (Leg)  [place] Flow Doppler Lower Extrem Right (Leg)   REASON FOR EXAM:    swelling of calf  COMMENTS:       PROCEDURE: [place] - UKorea[place]OPPLER LOW EXTR RIGHT  - Jan 16 2013  1:43AM     RESULT: Doppler interrogation of the deep venous system of the right leg   is performed from the common femoral vein through the popliteal vein. The   deep venous structures appear to be fully compressible with a normal   color Doppler and spectral Doppler appearance. There is no abnormal fluid   collection evident. Enlarged lymph nodes are noted in the right groin.    IMPRESSION:   1. No evidence of right lower extremity deep vein thrombosis. Right groin   adenopathy  present.  Dictation Site: 1        Verified By: GVernice Jefferson  BROWNE, M.D., MD    Impression 1. Sepsis: The patient presented with sepsis             blood cultures and urinalysis.             IV fluids            antibiotics 2. Right lower extremity pain and tenderness:              The patient will be started on intravenous vancomycin and Zosyn.              she has nonpalpable pulses and will need angiography for limb salvage but give her acute rena failure intervention will be delayed at this time.  She is at very high risk for limb loss on the right 3. Acute renal failure:             Unclear what is the patient's baseline creatinine, but she denies any history of renal disease.              Insert Foley catheter. Will hold her             Continue IV fluid hydration            Consult renal service.  4. Hyponatremia:             This is most likely due to volume depletion.             Will hold Lasix. Continue with intravenous fluids.  5. Diabetes mellitus, uncontrolled:             Will start the patient on insulin sliding scale and will start her on Lantus as well.  6.  Coronary artery disease s/p CABG             Nitrates as needed   Plan Level 5 consult   Electronic Signatures: Hortencia Pilar (MD)  (Signed 16-Apr-14 20:38)  Authored: General Aspect/Present Illness, Home Medications, Allergies, History and Physical Exam, Vital Signs, Labs, Radiology, Impression/Plan   Last Updated: 16-Apr-14 20:38 by Hortencia Pilar (MD)

## 2015-01-23 NOTE — Consult Note (Signed)
CHIEF COMPLAINT and HISTORY:  Subjective/Chief Complaint PAD, leg wound   History of Present Illness Patient is a 49 year old white female who was admitted 02/01/13 with chest pain and dyspnea. Recent NSTEMI, hx CAD, hx CABG, CHF. She has had several recent hospital admissions. We have seen her previously for PAD and right lower extremity wound/ infection. She is s/p I&D excisional debridement 01/18/13, s/p angiogram 01/23/13 with angioplasty right SFA stenosis/occlusion, angiopolasty right popliteal stenosis, tibioperoneal trunk, and multiple stenoses right post tib artery. Has left BKA with small wound that she reports has been healing.  We were consulted for further vascular evaluation.   PAST MEDICAL/SURGICAL HISTORY:  Past Medical History:   CAD:    Depression:    Asthma:    Hyperlipidemia:    PAD:    insulin dependent diabetes:    below knee amputation:    cabg x7:   ALLERGIES:  Allergies:  Bactrim: Other  Accupril: Other  HOME MEDICATIONS:  Home Medications: Medication Instructions Status  Lasix 40 mg oral tablet 1 tab(s) orally once a day Active  ferrous sulfate 325 mg oral tablet 1 tab(s) orally once a day Active  famotidine 20 mg oral tablet 1 tab(s) orally 2 times a day Active  silver sulfADIAZINE topical 1% topical cream Apply topically to affected area once a day Active  Combivent Respimat CFC free 100 mcg-20 mcg/inh inhalation aerosol 2 puff(s) inhaled every 6 hours, As Needed - for Shortness of Breath Active  Metoprolol Tartrate 50 mg oral tablet 1 tab(s) orally 2 times a day Active  clopidogrel 75 mg oral tablet 1 tab(s) orally once a day Active  atorvastatin 40 mg oral tablet 1 tab(s) orally once a day (at bedtime) Active  Cymbalta 60 mg oral delayed release capsule 1 cap(s) orally once a day Active  traZODone 50 mg oral tablet 1 tab(s) orally once (at bedtime) Active  gabapentin 300 mg oral capsule 1 cap(s) orally 3 times a day Active  Nitrostat 0.4 mg  sublingual tablet 1 tab(s) sublingual every 5 minutes x 5 days, As Needed x 3 - for Chest Pain Active  isosorbide mononitrate extended release 60 mg oral tablet, extended release 1 tab(s) orally once a day (in the morning) Active  oxyCODONE 5 mg oral tablet 1 tab(s) orally every 6 hours, As Needed - for Pain Active  acetaminophen 325 mg oral tablet 2 tab(s) orally every 4 hours, As needed, pain or temp. greater than 100.4 Active  docusate sodium 100 mg oral capsule 1 cap(s) orally 2 times a day, As Needed, constipation , As needed, constipation Active  amoxicillin-clavulanate 875 mg-125 mg oral tablet 1 tab(s) orally every 12 hours x 14 days Active  insulin glargine 100 units/mL subcutaneous solution 20  subcutaneous once a day (at bedtime) Active  Aspirin Enteric Coated 81 mg oral delayed release tablet 1 tab(s) orally once a day Active   Family and Social History:  Family History Hypertension  Diabetes Mellitus   Social History negative tobacco, negative ETOH, negative Illicit drugs, previous tobacco use   Review of Systems:  Fever/Chills No   Cough No   Sputum No   Abdominal Pain No   Diarrhea No   Constipation No   Nausea/Vomiting No   SOB/DOE No  on admission   Chest Pain No  on admission   Tolerating Diet Yes   Physical Exam:  GEN no acute distress   HEENT PERRL, moist oral mucosa, Oropharynx clear   NECK supple  No  masses   RESP normal resp effort  clear BS   CARD regular rate  no murmur   EXTR positive edema, Mild RLE edema; Difficult to palpate pedal pulses on right, left BKA   SKIN positive ulcers, RLE lower leg ulceration/erythema dressed; Left BKA with small wound without erythema   NEURO cranial nerves intact   PSYCH alert, A+O to time, place, person   LABS:  Laboratory Results: Routine Chem:    06-May-14 09:64, Basic Metabolic Panel (w/Total Calcium)  Glucose, Serum 122  BUN 23  Creatinine (comp) 1.14  Sodium, Serum 138  Potassium, Serum  4.6  Chloride, Serum 101  CO2, Serum 36  Calcium (Total), Serum 9.0  Anion Gap 1  Osmolality (calc) 281  eGFR (African American) >60  eGFR (Non-African American) 56  eGFR values <16m/min/1.73 m2 may be an indication of chronic  kidney disease (CKD).  Calculated eGFR is useful in patients with stable renal function.  The eGFR calculation will not be reliable in acutely ill patients  when serum creatinine is changing rapidly. It is not useful in   patients on dialysis. The eGFR calculation may not be applicable  to patients at the low and high extremes of body sizes, pregnant  women, and vegetarians.  Routine Hem:    06-May-14 09:05, CBC Profile  WBC (CBC) 11.7  RBC (CBC) 3.55  Hemoglobin (CBC) 10.3  Hematocrit (CBC) 31.0  Platelet Count (CBC) 499  MCV 88  MCH 29.2  MCHC 33.3  RDW 16.2  Neutrophil % 76.4  Lymphocyte % 12.5  Monocyte % 9.0  Eosinophil % 1.4  Basophil % 0.7  Neutrophil # 8.9  Lymphocyte # 1.5  Monocyte # 1.0  Eosinophil # 0.2  Basophil # 0.1  Result(s) reported on 05 Feb 2013 at 10:02AM.   ASSESSMENT AND PLAN:  Assessment/Admission Diagnosis Patient is a 49year old white female who was admitted 02/01/13 with chest pain and dyspnea. Recent NSTEMI, hx CAD, hx CABG, CHF. She has had several recent hospital admissions.   We were consulted for PAD and right lower extremity wound/ infection which we have seen her for this recently. She is s/p I&D excisional debridement 01/18/13, s/p angiogram 01/23/13 with angioplasty right SFA stenosis/occlusion, angiopolasty right popliteal stenosis, tibioperoneal trunk, and multiple stenoses right post tib artery. Has left BKA with small wound which does not appear infected. Surgery and wound care have evaluated and are treating her wound. Continue wound care. On Augmentin per ID. No further vascular interventions recommended at this time as that her procedure was about 2 weeks ago. We will plan for outpatient follow up ABI/duplex  studies to monitor her perfusion.   Electronic Signatures: HSu Grand(PA-C)  (Signed 06-May-14 21:51)  Authored: Chief Complaint and History, PAST MEDICAL/SURGICAL HISTORY, ALLERGIES, HOME MEDICATIONS, Family and Social History, Review of Systems, Physical Exam, LABS, Assessment and Plan   Last Updated: 06-May-14 21:51 by HSu Grand(PA-C)

## 2015-01-23 NOTE — H&P (Signed)
PATIENT NAMLenise [name]:  [name], [name] MR#:  [id number] DATE OF BIRTH:  Feb 03, 1966  DATE OF ADMISSION:  02/01/2013  Addendum  ASSESSMENT AND PLAN: Hypertension, appears to be uncontrolled, we will resume patient on her home medication, we will increase metoprolol dose to 75 mg by mouth twice daily, as well we will add on as needed hydralazine, as well patient received nitro paste in the ED.    ____________________________ Starleen Armsawood S. Maddox Bratcher, MD dse:ea D: 02/02/2013 01:18:22 ET T: 02/02/2013 03:27:20 ET JOB#: [id number]  cc: Starleen Armsawood S. Bao Coreas, MD, <Dictator> Shirlette Scarber Teena IraniS Breyton Vanscyoc MD ELECTRONICALLY SIGNED 02/03/2013 22:37
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
## 2015-01-23 NOTE — Consult Note (Signed)
General Aspect [name] is a 49 y.o. Caucasian female with PMHx s/f CAD s/p CABG (11/2011 at [facility]), PVD (s/p L BKA in 09/2012 at [facility], RLE segmental angioplasties 01/2013 at [facility]), chronic diastolic CHF, DM2 with diabetic neuropathy/vasculopathy, HLD, ongoing tobacco abuse, COPD, asthma and depression.   She was first seen in consultation by myself and Dr. Fletcher Anon 01/21/13 for NSTEMI, type 2 in the setting of sepsis sourced from RLE cellulitis/osteomyelitis and ARF. She improved on broad-spectrum antibiotics and eventually underwent segmental RLE angioplasties. She had a rare episode of chest discomfort responsive to NTG SL and underwent a Lexiscan Myoview showing LVEF 55%, moderate size reversible defect in the mid-distal anterior and anterolateral wall likely felt to represent anterior/anterolateral ischemia despite poor quality study and intense GI uptake. She declined cath initially preferring to recover first, and she was discharged. She developed accelerating angina shortly after and cath was planned, but she did not show. Antianginals were up-titrated with improvement. She followed up 02/18/13 explaining she wished to defer cath as her symptoms had improved and to allow further recovery from PV angioplasties.  She has been re-admitted to [facility] yesterday for fevers, leukocytosis and nonhealing RLE ulcer. She reports experiencing fevers to 103 and chills for the past several days. She denies any chest pain, SOB/DOE, PND or orthopnea during this time. She followed up with Dr. Lucky Cowboy 1-2 weeks ago, and had ABIs performed which were normal per patient. She continues to smoke 3 cigarettes/day. She bought nicotine patches recently.   Present Illness In the ED, CBC revealed a leukocytosis of 12.9K, otherwise WNL. CMP- mild hyponatremia (130), severe hypoalbuminemia (1.7) and AP elevated (146). ESR elevated (109). R tibia/fibula radiograph- large wound over the lateral aspect of the mid shin w/o evidence of  osteomyelitis. R foot radiograph- findngs c/w cellulitis, no osteomyelitis and cystic changes at the base of the 1st metatarsal favored to be chronic/degenerative; however, if associated with a local wound, infection was not excluded. She was started on IVF, BS ABX and admitted. Seen by surgery today with plans for wound debridment. Cardiology consulted for pre-op eval.  PAST MEDICAL HISTORY:  1. Coronary artery disease.  2. Diabetes mellitus.  3. Diabetic neuropathy.  4. Depression.  5. Hyperlipidemia.  6. Asthma.  7. PVD. 8. Chronic diastolic CHF 9. Ongoing tobacco abuse 10. COPD  PAST SURGICAL HISTORY:  1. Left BKA in December 2013 at [facility].  2. CABG in February 2013 at [facility].  3. RLE segmental angioplasties 01/2013 in setting of cellulitis/osteomyelitis  FAMILY HISTORY: Diabetes mellitus in the family.   SOCIAL HISTORY: The patient lives with her friend and smokes approximately 3 cigarettes/day.  She quit at some point, but now, however, has restarted. No history of EtOH or illicit drugs.   Physical Exam:  GEN no acute distress, obese   HEENT red conjunctivae, PERRL, hearing intact to voice   NECK supple  No masses  trachea midline   RESP normal resp effort  clear BS  no use of accessory muscles  distant breath sounds   CARD Regular rate and rhythm  Normal, S1, S2  No murmur   ABD denies tenderness  soft  normal BS   EXTR negative cyanosis/clubbing, L BKA appreciated, RLE wound wrap, tenderness to palpation, nonpitting edema   SKIN positive ulcers, tight to palpation   NEURO follows commands, motor/sensory function intact   PSYCH alert, A+O to time, place, person   Review of Systems:  Subjective/Chief Complaint RLE pain, fevers   General:  Weakness  leg pain    Skin: No Complaints    ENT: No Complaints    Eyes: No Complaints    Neck: No Complaints    Respiratory: No Complaints   Cardiovascular: No Complaints   Genitourinary: No Complaints    Vascular:  Leg cramping   Musculoskeletal: Muscle or joint pain   Neurologic: No Complaints    Hematologic: No Complaints    Endocrine: No Complaints    Psychiatric: No Complaints    Review of Systems: All other systems were reviewed and found to be negative   Medications/Allergies Reviewed Medications/Allergies reviewed           Admit Diagnosis:   CELLULITIS: Onset Date: 02-Apr-2013, Status: Active, Description: CELLULITIS  Home Medications: Medication Instructions Status  famotidine 20 mg oral tablet 1 tab(s) orally 2 times a day Active  ferrous sulfate 325 mg oral tablet 1 tab(s) orally once a day Active  Lasix 40 mg oral tablet 1 tab(s) orally once a day Active  isosorbide mononitrate extended release 60 mg oral tablet, extended release 1 tab(s) orally once a day (in the morning) Active  acetaminophen 325 mg oral tablet 2 tab(s) orally every 4 hours, As needed, for pain or temp. greater than 100.4 Active  oxyCODONE 5 mg oral tablet 1 tab(s) orally every 6 hours, As Needed - for Pain Active  Nitrostat 0.4 mg sublingual tablet 1 tab(s) sublingual every 5 minutes, As Needed - for Chest Pain Active  gabapentin 300 mg oral capsule 1 cap(s) orally 3 times a day Active  Cymbalta 60 mg oral delayed release capsule 1 cap(s) orally once a day Active  atorvastatin 40 mg oral tablet 1 tab(s) orally once a day (at bedtime) Active  clopidogrel 75 mg oral tablet 1 tab(s) orally once a day Active  Aspirin Enteric Coated 81 mg oral delayed release tablet 1 tab(s) orally once a day Active  albuterol CFC free 90 mcg/inh inhalation aerosol 2 puff(s) inhaled every 6 hours, As Needed - for Shortness of Breath Active  Lantus 100 units/mL subcutaneous solution 15 unit(s) subcutaneous once a day (in the morning) and 20 units every evening Active  Metoprolol Tartrate 50 mg oral tablet 1 tab(s) orally 2 times a day Active  traZODone 50 mg oral tablet 1 tab(s) orally once a day Active   Lab Results:  Hepatic:   01-Jul-14 05:37   Bilirubin, Total 0.4  Alkaline Phosphatase 132  SGPT (ALT) 15  SGOT (AST)  14  Total Protein, Serum 7.0  Albumin, Serum  1.7  Routine Chem:  01-Jul-14 05:37   Glucose, Serum  194  BUN 16  Creatinine (comp) 0.98  Sodium, Serum 139  Potassium, Serum 4.0  Chloride, Serum 103  CO2, Serum 30  Calcium (Total), Serum 8.9  Osmolality (calc) 284  eGFR (African American) >60  eGFR (Non-African American) >60 (eGFR values <18m/min/1.73 m2 may be an indication of chronic kidney disease (CKD). Calculated eGFR is useful in patients with stable renal function. The eGFR calculation will not be reliable in acutely ill patients when serum creatinine is changing rapidly. It is not useful in  patients on dialysis. The eGFR calculation may not be applicable to patients at the low and high extremes of body sizes, pregnant women, and vegetarians.)  Anion Gap  6  Routine Hem:  01-Jul-14 05:37   WBC (CBC) 10.5  RBC (CBC) 3.94  Hemoglobin (CBC) 12.0  Hematocrit (CBC) 35.0  Platelet Count (CBC) 312  MCV 89  MCH 30.3  MCHC 34.2  RDW  16.0  Neutrophil % 68.2  Lymphocyte % 18.8  Monocyte % 9.6  Eosinophil % 2.8  Basophil % 0.6  Neutrophil #  7.2  Lymphocyte # 2.0  Monocyte #  1.0  Eosinophil # 0.3  Basophil # 0.1 (Result(s) reported on 02 Apr 2013 at 06:16AM.)   EKG:  Interpretation EKG shows NSR, TWIs I, aVL, otherwise no ST/T changes   Rate 70   EKG Comparision Not changed from  prior 01/2013 tracing    Bactrim: Other  Accupril: Other  Vital Signs/Nurse's Notes: **Vital Signs.:   01-Jul-14 13:40  Vital Signs Type Routine  Temperature Temperature (F) 97.8  Celsius 36.5  Temperature Source oral  Pulse Pulse 73  Respirations Respirations 18  Systolic BP Systolic BP 625  Diastolic BP (mmHg) Diastolic BP (mmHg) 69  Mean BP 84  Pulse Ox % Pulse Ox % 97  Pulse Ox Activity Level  At rest  Oxygen Delivery Room Air/ 21 %    Impression 49 y.o. Caucasian  female with PMHx s/f CAD s/p CABG (11/2011 at [facility]), PVD (s/p L BKA in 09/2012 at [facility], RLE segmental angioplasties 01/2013 at [facility]), chronic diastolic CHF, DM2 with diabetic neuropathy/vasculopathy, HLD, ongoing tobacco abuse, COPD, asthma and depression.  1)Preop cardiovascular evaluation  plan  for wound debridment by surgery possibly tomorrow Known CAD s/p CABG EKG shows TWIs in the lateral leads- unchanged from prior tracings- otherwise nonacute.  Re-Review of Prior Myoview 01/2013  shows predominantly fixed defect on attenuation corrected images.No large region of ischemia. Significant GI uptake artifact. --Given that she is not having angina, no significant ischemia on stress test, would treat medically at this time. --Acceptable risk for surgery, no further cardiac testing at this time. -- Continue ASA, Plavix, BB, Imdur, NTG SL PRN -- Increase atorvastatin to 29m daily -- Consider adding low dose ACEi with underlying DM2/CAD if BP can tolerate   2)  PVD/RLE nonhealing ulceration with cellulitis H/o severe PVD s/p prior L BKA and RLE angioplasties. Suspect ongoing tobacco abuse/insufficiency of RLE peripheral vasculature and propensity for infection with underlying DM2 contributing; however, ABIs per patient were normal 1-2 weeks ago. The plan has been made for wound debridment by surgery. --On broad abx, vanco and zosyn   Plan 3. Chronic diastolic CHF Euovolemic on exam today. Monitor volume status.  -- Continue Lasix  4. HLD -- Increase atorvastatin as above  5. Tobacco abuse Continues to smoke 3 cigs/day. Recently bought patches.  -- Needs to stop smoking.   Electronic Signatures: AMeriel Pica(PA-C)  (Signed 01-Jul-14 15:53)  Authored: General Aspect/Present Illness, History and Physical Exam, Review of System, Home Medications, Labs, EKG , Allergies, Vital Signs/Nurse's Notes, Impression/Plan GIda Rogue(MD)  (Signed 01-Jul-14 18:29)  Authored: Review of System,  Health Issues, EKG , Impression/Plan  Co-Signer: General Aspect/Present Illness, Home Medications, Labs, Allergies, Vital Signs/Nurse's Notes, Impression/Plan   Last Updated: 01-Jul-14 18:29 by GIda Rogue(MD)

## 2015-01-23 NOTE — Consult Note (Signed)
PATIENT NAME:  [name], [name] MR#:  [id number] DATE OF BIRTH:  05/30/1966  DATE OF CONSULTATION:  01/26/2013  CONSULTING PHYSICIAN:  Madolyn Frieze. Kel Senn, MD  HISTORY OF PRESENT ILLNESS: The patient is a 49 year old female with a past medical history of coronary artery disease, status post coronary bypass graft, peripheral vascular disease, diabetes mellitus, hypertension, hyperlipidemia, who I am asked to evaluate for chest pain. The patient is status post coronary artery bypass graft at [facility] in February 2013. I do not have those records available. She has had intermittent chest pain since her bypass surgery by her report. She was hospitalized at [facility] recently for lower extremity cellulitis/infection and peripheral vascular disease. She underwent revascularization of her right lower extremity and was placed on antibiotics. She was discharged on the 25th of April. While at home, she developed left-sided chest pain. The pain was described as a stabbing/pressure feeling. There was some pain in her left arm. There was associated nausea, shortness of breath and diaphoresis. The pain lasted for approximately 30 minutes and resolved with nitroglycerin. It was not pleuritic, positional or related to food. She presented to the Emergency Room and was admitted for rule out myocardial infarction and cardiology was asked to evaluate. She has had no pain since admission. Note, she did have a CT that showed no pulmonary embolus. Note, the patient also had a recent echocardiogram that showed an ejection fraction of 50% to 55%.   PAST MEDICAL HISTORY: Includes diabetes mellitus for approximately 14 to 15 years. She has a history of hypertension and hyperlipidemia. She has a history of coronary disease and is status post coronary bypass graft as described in the HPI. She also has a history of peripheral vascular disease. She has had prior left BKA. She had recent percutaneous intervention for disease in her  right lower extremity. The patient also has a history of renal failure and contrast nephropathy.   FAMILY HISTORY: The patient states she does not know.   SOCIAL HISTORY: She does smoke. She occasionally consumes alcohol.   PHYSICAL EXAMINATION:  GENERAL: She is well developed and somewhat obese. She does not appear to be in acute distress. She does appear to be chronically ill. Her skin is warm and dry. She does not appear to be depressed and there is no peripheral clubbing. Her back is normal.  HEENT: Normal with normal eyelids.  NECK: Supple with a normal upstroke bilaterally. There are no bruits noted. There is no jugular venous distention and I cannot appreciate thyromegaly.  CHEST: Diminished breath sounds at the bases.  CARDIOVASCULAR: Regular rate and rhythm with normal S1 and S2. I cannot appreciate murmurs, rubs or gallops.  ABDOMEN: Not tender or distended. Positive bowel sounds. No hepatosplenomegaly. No masses appreciated. There is no abdominal bruit.  EXTREMITIES: She has 2+ femoral pulse on the right and 1+ on the left. She had recent access to the left femoral artery. She has had a prior left BKA. Her right lower extremity has a wound VAC in place. There is significant erythema. I cannot palpate distal pulses.  NEUROLOGIC: She does move all extremities. Cranial nerves II through XII are grossly intact.  VITAL SIGNS: Show a blood pressure of 135/66 and her pulse is 88. She is not febrile.   LABORATORY, DIAGNOSTIC AND RADIOLOGICAL DATA: Her laboratories show an elevated BNP of 13,557. Glucose is 243. BUN and creatinine are 11 and 1.11. Sodium is 138 with a potassium of 4.0, chloride 103. Troponins are 0.05 and 0.05  respectively. White blood cell count is 13.7 with a hemoglobin of 8.9, hematocrit 27.9. Her platelet count is 616,000. Arterial blood gas shows a pH of 7.41 and a pO2 of 59. A chest CT shows no pulmonary embolus. The findings were concerning for pulmonary edema and there was  nonspecific lymphadenopathy. A head CT shows no acute intracranial process. Electrocardiogram shows sinus rhythm with nonspecific ST changes.   DIAGNOSES:  1.  Chest pain - The patient presents with chest pain that is somewhat concerning. However, she has significant comorbidities, including recent infection of right lower extremity. She also has a history of contrast nephropathy. She would be at high risk for cardiac catheterization unless absolutely necessary. She also states she has had some intermittent chest pain since her bypass surgery. I would recommend continuing to cycle enzymes. We will plan to proceed with a stress Myoview on Monday. If it is normal or low risk, I would favor continued medical therapy.  2.  Coronary artery disease - Continue aspirin, Plavix and statin.  3.  Acute diastolic congestive heart failure - The patient had some pulmonary edema on her chest CT when she was admitted. She may have some residual volume excess from recent hydration following her recent hospitalization. I agree with continued Lasix and following renal function closely.  4.  Hypertension - Continue present medications.  5.  Hyperlipidemia - Continue statin.   We will follow while she is in the hospital.   ____________________________ Madolyn FriezeBrian S. Jens Somrenshaw, MD bsc:jm D: 01/26/2013 14:00:40 ET T: 01/26/2013 14:29:06 ET JOB#: [id number]  cc: Madolyn FriezeBrian S. Jens Somrenshaw, MD, <Dictator> Lewayne BuntingBRIAN S Magdaleno Lortie MD ELECTRONICALLY SIGNED 03/14/2013 11:47

## 2015-01-24 NOTE — Op Note (Signed)
PATIENT NAME:  [name] ArenaCATHCART, Agape MR#:  [id number] DATE OF BIRTH:  Feb 24, 1966  DATE OF PROCEDURE:  10/09/2013  PROCEDURE  PERFORMED: 1. Pars plana vitrectomy of the right eye.  2. Tractional retinal detachment repair of the right eye.  3. Endolaser, right eye.  4. Gas air exchange of the right eye.   PREOPERATIVE DIAGNOSES: 1. Proliferative diabetic retinopathy, right eye.  2. Vitreous hemorrhage, right eye.   POSTOPERATIVE DIAGNOSES:  1. Proliferative diabetic retinopathy, right eye.  2. Vitreous hemorrhage, right eye.  3. 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   PRIMARY SURGEON: Aron BabaMatthew Trenesha Alcaide, M.D.   ANESTHESIA: Retrobulbar block of the right eye with monitored anesthesia care.   COMPLICATIONS: None.   ESTIMATED BLOOD LOSS: Less than 1 mL.   INDICATIONS FOR PROCEDURE: The patient presented to my office with sudden loss of vision in the right eye. Examination revealed a dense vitreous hemorrhage. Time was allowed to try and let it clear on its own and this failed. Risks, benefits, and alternatives of the above procedure were discussed and the patient wished to proceed.   DETAILS OF PROCEDURE: After informed consent was obtained, the patient was [facility]. The patient was placed in supine position, was given a small dose of Alfenta and a retrobulbar block was performed in the right eye by the primary surgeon without any complications. The right eye was prepped and draped in sterile manner. After lid speculum was inserted, a 25-gauge trocar was placed inferotemporally through displaced conjunctiva in an oblique fashion 3 mm beyond the limbus. The infusion cannula was turned on and inserted through the trocar and secured in position with Steri-Strips. Two more trocars were placed in a similar fashion superotemporally and superonasally. Vitreous cutter and light pipe were introduced in the eye and a core vitrectomy was  performed. The peripheral vitreous was trimmed in order to ensure that there was no more adhesion between anterior and posterior vitreous. The peripheral vitreous was trimmed for 360 degrees. The posterior vitreous was trimmed down to the area of proliferative disease. It became apparent at that time that the inferior temporal arcade was detached due to traction of the proliferative membrane. Using a combination of the vitreous cutter and forceps, the membranes of the tractional attachment were dissected and peeled away without any complication. The retina was noted to visibly relax. Once all membranes had been removed, endolaser was introduced and panretinal photocoagulation was performed for 360 degrees. Direct visualization was utilized in order to trim away as much blood in the inferior vitreous base as safely possible. Scleral depressed exam revealed no signs of any breaks, tears, or retinal detachment for 360 degrees. A partial air-fluid exchange was performed and the trocars were removed. The wounds were noted to be airtight and the eye was pressurized to approximately 15 mmHg. Five milligrams of dexamethasone was given into the inferior fornix and the lid speculum was removed. The eye was cleaned and TobraDex was placed in the eye. A patch and shield were placed over the eye and the patient was taken to postanesthesia care with instructions to remain head up.     ____________________________ Ignacia FellingMatthew F. Champ MungoAppenzeller, MD mfa:sg D: 10/09/2013 09:45:13 ET T: 10/09/2013 10:08:43 ET JOB#: [id number]  cc: Ignacia FellingMatthew F. Champ MungoAppenzeller, MD, <Dictator>  Cline CoolsMATTHEW F Markanthony Gedney MD ELECTRONICALLY SIGNED 10/23/2013 7:11

## 2021-11-03 DEATH — deceased
